# Patient Record
Sex: Male | Born: 1960 | Race: Black or African American | Hispanic: No | Smoking: Never smoker
Health system: Southern US, Community
[De-identification: ages and names within clinical notes are randomized; demographics above are authoritative.]

## PROBLEM LIST (undated history)

## (undated) DIAGNOSIS — K921 Melena: Secondary | ICD-10-CM

## (undated) DIAGNOSIS — I10 Essential (primary) hypertension: Secondary | ICD-10-CM

## (undated) DIAGNOSIS — F329 Major depressive disorder, single episode, unspecified: Secondary | ICD-10-CM

## (undated) DIAGNOSIS — N390 Urinary tract infection, site not specified: Secondary | ICD-10-CM

## (undated) DIAGNOSIS — Z8614 Personal history of Methicillin resistant Staphylococcus aureus infection: Secondary | ICD-10-CM

## (undated) DIAGNOSIS — G8220 Paraplegia, unspecified: Secondary | ICD-10-CM

## (undated) DIAGNOSIS — W3400XA Accidental discharge from unspecified firearms or gun, initial encounter: Secondary | ICD-10-CM

## (undated) DIAGNOSIS — E785 Hyperlipidemia, unspecified: Secondary | ICD-10-CM

## (undated) DIAGNOSIS — S91309A Unspecified open wound, unspecified foot, initial encounter: Secondary | ICD-10-CM

## (undated) DIAGNOSIS — F32A Depression, unspecified: Secondary | ICD-10-CM

## (undated) DIAGNOSIS — Z202 Contact with and (suspected) exposure to infections with a predominantly sexual mode of transmission: Secondary | ICD-10-CM

## (undated) DIAGNOSIS — M5412 Radiculopathy, cervical region: Secondary | ICD-10-CM

## (undated) HISTORY — DX: Major depressive disorder, single episode, unspecified: F32.9

## (undated) HISTORY — DX: Unspecified open wound, unspecified foot, initial encounter: S91.309A

## (undated) HISTORY — DX: Contact with and (suspected) exposure to infections with a predominantly sexual mode of transmission: Z20.2

## (undated) HISTORY — DX: Radiculopathy, cervical region: M54.12

## (undated) HISTORY — PX: CHOLECYSTECTOMY: SHX55

## (undated) HISTORY — PX: BACK SURGERY: SHX140

## (undated) HISTORY — DX: Hyperlipidemia, unspecified: E78.5

## (undated) HISTORY — DX: Personal history of Methicillin resistant Staphylococcus aureus infection: Z86.14

## (undated) HISTORY — DX: Melena: K92.1

## (undated) HISTORY — DX: Depression, unspecified: F32.A

## (undated) HISTORY — DX: Urinary tract infection, site not specified: N39.0

## (undated) HISTORY — PX: OTHER SURGICAL HISTORY: SHX169

---

## 1989-01-29 DIAGNOSIS — W3400XA Accidental discharge from unspecified firearms or gun, initial encounter: Secondary | ICD-10-CM

## 1989-01-29 HISTORY — DX: Accidental discharge from unspecified firearms or gun, initial encounter: W34.00XA

## 2009-01-29 HISTORY — PX: NECK SURGERY: SHX720

## 2011-02-22 ENCOUNTER — Emergency Department (HOSPITAL_COMMUNITY)
Admission: EM | Admit: 2011-02-22 | Discharge: 2011-02-23 | Disposition: A | Discharge status: Home / Self Care | Payer: Medicare Other | Attending: Emergency Medicine | Admitting: Emergency Medicine

## 2011-02-22 ENCOUNTER — Encounter (HOSPITAL_COMMUNITY): Payer: Self-pay | Admitting: Emergency Medicine

## 2011-02-22 DIAGNOSIS — R3919 Other difficulties with micturition: Secondary | ICD-10-CM | POA: Insufficient documentation

## 2011-02-22 DIAGNOSIS — I10 Essential (primary) hypertension: Secondary | ICD-10-CM | POA: Insufficient documentation

## 2011-02-22 DIAGNOSIS — G8220 Paraplegia, unspecified: Secondary | ICD-10-CM | POA: Insufficient documentation

## 2011-02-22 DIAGNOSIS — N390 Urinary tract infection, site not specified: Secondary | ICD-10-CM | POA: Insufficient documentation

## 2011-02-22 DIAGNOSIS — Z79899 Other long term (current) drug therapy: Secondary | ICD-10-CM | POA: Insufficient documentation

## 2011-02-22 HISTORY — DX: Essential (primary) hypertension: I10

## 2011-02-22 HISTORY — DX: Paraplegia, unspecified: G82.20

## 2011-02-22 LAB — URINALYSIS, ROUTINE W REFLEX MICROSCOPIC
Glucose, UA: NEGATIVE mg/dL
Hgb urine dipstick: NEGATIVE
Ketones, ur: NEGATIVE mg/dL
Protein, ur: NEGATIVE mg/dL

## 2011-02-22 LAB — URINE MICROSCOPIC-ADD ON

## 2011-02-22 MED ORDER — LIDOCAINE HCL (PF) 1 % IJ SOLN
INTRAMUSCULAR | Status: AC
  Administered 2011-02-22: 5 mL
  Filled 2011-02-22: qty 5

## 2011-02-22 MED ORDER — CEFTRIAXONE SODIUM 1 G IJ SOLR
1.0000 g | Freq: Once | INTRAMUSCULAR | Status: AC
  Administered 2011-02-22: 1 g via INTRAMUSCULAR
  Filled 2011-02-22: qty 10

## 2011-02-22 MED ORDER — CEFIXIME 400 MG PO TABS: 400.0000 mg | ORAL_TABLET | Freq: Every day | ORAL | Status: AC
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
## 2011-02-22 NOTE — ED Notes (Signed)
PT. REPORTS CONCENTRATED URINE WITH FOUL ODOR FOR 4 DAYS - STATES HE CAN TELL HE HAS UTI - PT.  CATHETERIZED SELF .

## 2011-02-23 NOTE — ED Notes (Signed)
Pt came to ED ref. Urine smelling bad and being concentrated for several days

## 2011-02-24 LAB — URINE CULTURE: Colony Count: >=100000

## 2011-02-24 NOTE — ED Provider Notes (Signed)
Medical screening examination/treatment/procedure(s) were performed by non-physician practitioner and as supervising physician I was immediately available for consultation/collaboration.   Elanor Cale, MD 02/24/11 0708 

## 2011-02-25 NOTE — ED Notes (Signed)
+   Urine. Treated with Suprax. Sensitive to same. Per protocol MD.

## 2011-03-05 ENCOUNTER — Encounter: Payer: Self-pay | Admitting: Gastroenterology

## 2011-03-20 ENCOUNTER — Ambulatory Visit: Payer: Medicare Other | Admitting: Gastroenterology

## 2011-04-06 ENCOUNTER — Ambulatory Visit (INDEPENDENT_AMBULATORY_CARE_PROVIDER_SITE_OTHER): Payer: Medicare Other | Admitting: Gastroenterology

## 2011-04-06 ENCOUNTER — Encounter: Payer: Self-pay | Admitting: Gastroenterology

## 2011-04-06 DIAGNOSIS — R109 Unspecified abdominal pain: Secondary | ICD-10-CM

## 2011-04-06 DIAGNOSIS — K5900 Constipation, unspecified: Secondary | ICD-10-CM | POA: Insufficient documentation

## 2011-04-06 DIAGNOSIS — Z0000 Encounter for general adult medical examination without abnormal findings: Secondary | ICD-10-CM

## 2011-04-06 NOTE — Patient Instructions (Signed)
Please start taking citrucel (orange flavored) powder fiber supplement.  This may cause some bloating at first but that usually goes away. Begin with a small spoonful and work your way up to a large, heaping spoonful daily over a week. Start smaller more frequent meals. We will get records from your most recent gastroenterologist (colonoscopy, EGD reports) Dr. Valarie Cones in [place]. Return to see Dr. Christella Hartigan in 4 weeks. Dietician referral for general healthy diet.
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
## 2011-04-12 ENCOUNTER — Telehealth: Payer: Self-pay | Admitting: Gastroenterology
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
## 2011-04-14 ENCOUNTER — Encounter: Payer: Self-pay | Admitting: Internal Medicine

## 2011-04-14 DIAGNOSIS — Z0000 Encounter for general adult medical examination without abnormal findings: Secondary | ICD-10-CM | POA: Insufficient documentation

## 2011-04-14 DIAGNOSIS — G8220 Paraplegia, unspecified: Secondary | ICD-10-CM | POA: Insufficient documentation

## 2011-04-14 DIAGNOSIS — I10 Essential (primary) hypertension: Secondary | ICD-10-CM | POA: Insufficient documentation

## 2011-04-17 ENCOUNTER — Ambulatory Visit (INDEPENDENT_AMBULATORY_CARE_PROVIDER_SITE_OTHER): Payer: Medicare Other | Admitting: Internal Medicine

## 2011-04-17 ENCOUNTER — Other Ambulatory Visit (INDEPENDENT_AMBULATORY_CARE_PROVIDER_SITE_OTHER): Payer: Medicare Other

## 2011-04-17 ENCOUNTER — Encounter: Payer: Self-pay | Admitting: Internal Medicine

## 2011-04-17 VITALS — BP 124/80 | HR 90 | Temp 99.3°F | Ht 71.0 in | Wt 166.0 lb

## 2011-04-17 DIAGNOSIS — Z8614 Personal history of Methicillin resistant Staphylococcus aureus infection: Secondary | ICD-10-CM | POA: Insufficient documentation

## 2011-04-17 DIAGNOSIS — R8290 Unspecified abnormal findings in urine: Secondary | ICD-10-CM | POA: Insufficient documentation

## 2011-04-17 DIAGNOSIS — R5383 Other fatigue: Secondary | ICD-10-CM

## 2011-04-17 DIAGNOSIS — I10 Essential (primary) hypertension: Secondary | ICD-10-CM

## 2011-04-17 DIAGNOSIS — E785 Hyperlipidemia, unspecified: Secondary | ICD-10-CM

## 2011-04-17 DIAGNOSIS — M5090 Cervical disc disorder, unspecified, unspecified cervical region: Secondary | ICD-10-CM | POA: Insufficient documentation

## 2011-04-17 DIAGNOSIS — R82998 Other abnormal findings in urine: Secondary | ICD-10-CM

## 2011-04-17 DIAGNOSIS — F32A Depression, unspecified: Secondary | ICD-10-CM | POA: Insufficient documentation

## 2011-04-17 DIAGNOSIS — R5381 Other malaise: Secondary | ICD-10-CM

## 2011-04-17 DIAGNOSIS — F329 Major depressive disorder, single episode, unspecified: Secondary | ICD-10-CM | POA: Insufficient documentation

## 2011-04-17 DIAGNOSIS — N320 Bladder-neck obstruction: Secondary | ICD-10-CM | POA: Insufficient documentation

## 2011-04-17 DIAGNOSIS — Z202 Contact with and (suspected) exposure to infections with a predominantly sexual mode of transmission: Secondary | ICD-10-CM | POA: Insufficient documentation

## 2011-04-17 DIAGNOSIS — I739 Peripheral vascular disease, unspecified: Secondary | ICD-10-CM | POA: Insufficient documentation

## 2011-04-17 DIAGNOSIS — S91309A Unspecified open wound, unspecified foot, initial encounter: Secondary | ICD-10-CM

## 2011-04-17 HISTORY — DX: Unspecified open wound, unspecified foot, initial encounter: S91.309A

## 2011-04-17 HISTORY — DX: Contact with and (suspected) exposure to infections with a predominantly sexual mode of transmission: Z20.2

## 2011-04-17 HISTORY — DX: Hyperlipidemia, unspecified: E78.5

## 2011-04-17 HISTORY — DX: Personal history of Methicillin resistant Staphylococcus aureus infection: Z86.14

## 2011-04-17 LAB — CBC WITH DIFFERENTIAL/PLATELET
Basophils Absolute: 0 10*3/uL (ref 0.0–0.1)
Eosinophils Absolute: 0.1 10*3/uL (ref 0.0–0.7)
Lymphocytes Relative: 22.1 % (ref 12.0–46.0)
MCHC: 33.5 g/dL (ref 30.0–36.0)
Monocytes Absolute: 0.5 10*3/uL (ref 0.1–1.0)
Neutro Abs: 4.7 10*3/uL (ref 1.4–7.7)
Neutrophils Relative %: 68.5 % (ref 43.0–77.0)
RDW: 14.1 % (ref 11.5–14.6)

## 2011-04-17 LAB — URINALYSIS, ROUTINE W REFLEX MICROSCOPIC
Bilirubin Urine: NEGATIVE
Ketones, ur: NEGATIVE
Nitrite: POSITIVE
Total Protein, Urine: NEGATIVE

## 2011-04-17 LAB — BASIC METABOLIC PANEL
CO2: 36 mEq/L — ABNORMAL HIGH (ref 19–32)
Chloride: 99 mEq/L (ref 96–112)
Potassium: 5 mEq/L (ref 3.5–5.1)
Sodium: 143 mEq/L (ref 135–145)

## 2011-04-17 LAB — HEPATIC FUNCTION PANEL
ALT: 36 U/L (ref 0–53)
Alkaline Phosphatase: 80 U/L (ref 39–117)
Bilirubin, Direct: 0.1 mg/dL (ref 0.0–0.3)
Total Protein: 7.8 g/dL (ref 6.0–8.3)

## 2011-04-17 LAB — LIPID PANEL: Total CHOL/HDL Ratio: 4

## 2011-04-17 LAB — PSA: PSA: 1.37 ng/mL (ref 0.10–4.00)

## 2011-04-17 LAB — LDL CHOLESTEROL, DIRECT: Direct LDL: 155.1 mg/dL

## 2011-04-17 MED ORDER — CEPHALEXIN 500 MG PO CAPS: 500.0000 mg | ORAL_CAPSULE | Freq: Four times a day (QID) | ORAL | Status: AC

## 2011-04-17 MED ORDER — ATENOLOL 50 MG PO TABS: 50.0000 mg | ORAL_TABLET | Freq: Two times a day (BID) | ORAL | Status: DC

## 2011-04-17 MED ORDER — CHLORTHALIDONE 25 MG PO TABS: 25.0000 mg | ORAL_TABLET | Freq: Every day | ORAL | Status: DC

## 2011-04-17 MED ORDER — PREGABALIN 300 MG PO CAPS: 300.0000 mg | ORAL_CAPSULE | Freq: Every day | ORAL | Status: DC

## 2011-04-17 MED ORDER — TIZANIDINE HCL 4 MG PO TABS: 4.0000 mg | ORAL_TABLET | Freq: Every day | ORAL | Status: DC

## 2011-04-17 MED ORDER — POTASSIUM CHLORIDE ER 10 MEQ PO TBCR: 10.0000 meq | EXTENDED_RELEASE_TABLET | Freq: Every day | ORAL | Status: DC

## 2011-04-17 MED ORDER — AMITRIPTYLINE HCL 100 MG PO TABS: 100.0000 mg | ORAL_TABLET | Freq: Every day | ORAL | Status: DC

## 2011-04-17 NOTE — Patient Instructions (Addendum)
Please sign the Release of information form for Dr Tilford Pillar in [place] Take all new medications as prescribed - the antibiotic Continue all other medications as before You are given all the refills today Please have the pharmacy call with any other refills you may need. Please go to LAB in the Basement for the blood and/or urine tests to be done today You will be contacted by phone if any changes need to be made immediately.  Otherwise, you will receive a letter about your results with an explanation. You will be contacted regarding the referral for: urology, and orthopedic, as well as the Leg circulation test (LE arterial dopplers)
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
## 2011-04-18 ENCOUNTER — Encounter: Payer: Self-pay | Admitting: Internal Medicine

## 2011-04-18 ENCOUNTER — Other Ambulatory Visit: Payer: Self-pay | Admitting: Internal Medicine

## 2011-04-18 MED ORDER — CYCLOBENZAPRINE HCL 5 MG PO TABS: 5.0000 mg | ORAL_TABLET | Freq: Three times a day (TID) | ORAL | Status: AC | PRN

## 2011-04-18 MED ORDER — ATORVASTATIN CALCIUM 20 MG PO TABS: 20.0000 mg | ORAL_TABLET | Freq: Every day | ORAL | Status: DC

## 2011-04-18 MED ORDER — AMITRIPTYLINE HCL 100 MG PO TABS: ORAL_TABLET | ORAL | Status: DC

## 2011-04-19 ENCOUNTER — Encounter: Payer: Self-pay | Admitting: Internal Medicine

## 2011-04-19 LAB — URINE CULTURE: Colony Count: >=100000

## 2011-04-22 ENCOUNTER — Encounter: Payer: Self-pay | Admitting: Internal Medicine

## 2011-04-22 DIAGNOSIS — E785 Hyperlipidemia, unspecified: Secondary | ICD-10-CM | POA: Insufficient documentation

## 2011-04-22 NOTE — Assessment & Plan Note (Addendum)
For urine studies, for antibx course,  to f/u any worsening symptoms or concerns, for urology referral  Note:  Time for pt hx, exam, review of record with pt in room, determination of diagnosis and plan for further eval and tx is > 50 min

## 2011-04-22 NOTE — Assessment & Plan Note (Signed)
Etiology unclear, Exam otherwise benign, to check labs as documented, follow with expectant management  

## 2011-04-22 NOTE — Assessment & Plan Note (Signed)
stable overall by hx and exam, most recent data reviewed with pt, and pt to continue medical treatment as before  BP Readings from Last 3 Encounters:  04/17/11 124/80  04/06/11 124/76  02/22/11 120/87

## 2011-04-22 NOTE — Assessment & Plan Note (Signed)
stable overall by hx and exam, and pt to continue medical treatment as beforem to check lpids, consider statin for LDL > 100

## 2011-04-22 NOTE — Assessment & Plan Note (Signed)
For LE art dopplers, Continue all other medications as before,  to f/u any worsening symptoms or concerns

## 2011-04-22 NOTE — Assessment & Plan Note (Signed)
With recurtting neck pain and ? cts pain to wrists/arms?  For ortho referral

## 2011-04-22 NOTE — Assessment & Plan Note (Signed)
Also for PSA,  to f/u any worsening symptoms or concerns, has been referred to urology

## 2011-04-23 ENCOUNTER — Other Ambulatory Visit: Payer: Self-pay | Admitting: Cardiology

## 2011-04-23 DIAGNOSIS — I739 Peripheral vascular disease, unspecified: Secondary | ICD-10-CM

## 2011-05-17 ENCOUNTER — Ambulatory Visit: Payer: Medicare Other | Admitting: Internal Medicine

## 2011-05-17 DIAGNOSIS — Z0289 Encounter for other administrative examinations: Secondary | ICD-10-CM

## 2011-05-18 ENCOUNTER — Ambulatory Visit: Payer: Medicare Other | Admitting: Internal Medicine

## 2011-05-23 ENCOUNTER — Ambulatory Visit: Payer: Medicare Other | Admitting: Internal Medicine

## 2011-05-23 DIAGNOSIS — Z0289 Encounter for other administrative examinations: Secondary | ICD-10-CM

## 2011-05-30 ENCOUNTER — Ambulatory Visit: Payer: Medicare Other | Admitting: *Deleted

## 2011-06-20 ENCOUNTER — Ambulatory Visit: Payer: Medicare Other | Admitting: *Deleted

## 2011-07-10 ENCOUNTER — Telehealth: Payer: Self-pay | Admitting: Internal Medicine

## 2011-07-10 NOTE — Telephone Encounter (Signed)
Caller: Zymier/Patient; PCP: Oliver Barre; CB#: [phone]; ; ; Call regarding Taking Lyrica 200mg  TID; Feels Like It Is Making Him Anxious/Jumpy; recently changed from 300mg  daily to 600mg  daily.  States he has felt  mildly nauseated "for a month," since changing the dose.  Per protocol, emergent symptoms denied; advised appt within 24 hours.  Appt sched first available 07/11/11 1615 with  Dr. Jonny Ruiz.

## 2011-07-10 NOTE — Telephone Encounter (Signed)
Not sure about this, can he call the MD who changed his prescription for advice?

## 2011-07-10 NOTE — Telephone Encounter (Signed)
Called and informed the patient of MD's instructions. 

## 2011-07-11 ENCOUNTER — Ambulatory Visit: Payer: Medicare Other | Admitting: Internal Medicine

## 2011-07-11 DIAGNOSIS — Z0289 Encounter for other administrative examinations: Secondary | ICD-10-CM

## 2011-07-12 ENCOUNTER — Other Ambulatory Visit: Payer: Self-pay | Admitting: Gastroenterology

## 2011-07-12 NOTE — Telephone Encounter (Signed)
Dr Christella Hartigan the pt would a rx for Nexium, it is not on his med list but says he was given it at a previous GI.  Do you want to send it in for him?

## 2011-07-12 NOTE — Telephone Encounter (Signed)
The pt will try OTC prilosec but does not want to schedule ROV at this time.  He will call back when he is not at work

## 2011-07-12 NOTE — Telephone Encounter (Signed)
Recommend OTC prilosec, prevacid or generic equivalent once daily 20-30 min before breakfast meal.  He has not followed up since ngi visit 3 months ago, can you reschedule his rov and he and I can discuss response to OTC PPI and consider nexium at that point.  Thanks

## 2011-07-20 ENCOUNTER — Ambulatory Visit: Payer: Medicare Other | Admitting: Gastroenterology

## 2011-07-23 ENCOUNTER — Telehealth: Payer: Self-pay | Admitting: Gastroenterology

## 2011-07-23 NOTE — Telephone Encounter (Signed)
Message copied by Arna Snipe on Mon Jul 23, 2011  8:11 AM ------      Message from: Donata Duff      Created: Fri Jul 20, 2011  9:06 AM       Do not bill

## 2011-07-31 ENCOUNTER — Telehealth: Payer: Self-pay

## 2011-07-31 DIAGNOSIS — R634 Abnormal weight loss: Secondary | ICD-10-CM

## 2011-07-31 NOTE — Telephone Encounter (Signed)
The patient called back and he did not need the two referrals he requested.

## 2011-07-31 NOTE — Telephone Encounter (Signed)
Called the patient left message to call back 

## 2011-07-31 NOTE — Telephone Encounter (Signed)
The patient called back to inform he would like a Dietician referral.  He stated his GI MD suggested to help with his diet. Please advise

## 2011-07-31 NOTE — Telephone Encounter (Signed)
Dietary done  Would still need a reason/diagnosis for ENT referral if he wants this (actually since he has medicare, he does not need a formal paper referral, he can self-refer - I would suggest Dr Haroldine Laws or another in his office)

## 2011-07-31 NOTE — Telephone Encounter (Signed)
Patient is requesting a referral to ENT and Dietician.

## 2011-07-31 NOTE — Telephone Encounter (Signed)
I would need a reason for both please

## 2011-08-20 ENCOUNTER — Telehealth: Payer: Self-pay | Admitting: Internal Medicine

## 2011-08-20 ENCOUNTER — Encounter: Payer: Self-pay | Admitting: Internal Medicine

## 2011-08-20 ENCOUNTER — Ambulatory Visit (INDEPENDENT_AMBULATORY_CARE_PROVIDER_SITE_OTHER): Payer: Medicare Other | Admitting: Internal Medicine

## 2011-08-20 VITALS — BP 118/80 | HR 77 | Temp 98.1°F | Ht 70.0 in | Wt 163.0 lb

## 2011-08-20 DIAGNOSIS — E785 Hyperlipidemia, unspecified: Secondary | ICD-10-CM

## 2011-08-20 DIAGNOSIS — M542 Cervicalgia: Secondary | ICD-10-CM

## 2011-08-20 DIAGNOSIS — M5412 Radiculopathy, cervical region: Secondary | ICD-10-CM

## 2011-08-20 DIAGNOSIS — I10 Essential (primary) hypertension: Secondary | ICD-10-CM

## 2011-08-20 HISTORY — DX: Radiculopathy, cervical region: M54.12
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
## 2011-08-20 NOTE — Assessment & Plan Note (Signed)
With worsening left neck pain/HA and numbness/weakness LUE- for MRI, and refer NS

## 2011-08-20 NOTE — Patient Instructions (Addendum)
Continue all other medications as before You will be contacted regarding the referral for: MRI and Neurosurgury referral Please continue your efforts at being more active, low cholesterol diet, and weight control. OK to cancel the Aug 8 appointment with me Please return in 6 months, or sooner if needed

## 2011-08-20 NOTE — Telephone Encounter (Signed)
Caller: [name]/Patient; PCP: Oliver Barre; CB#: [phone];  Call regarding Intermittent  Episodes of Lightheadedness With Intermittent Headaches From Base of Skull;  Onset 08/18/11.  Afebrile.  BP 122/84 at 0730.  New onset of mild blurred vision during headache/lightheadness episode at 0730.  Headache pain rated  7/10.  No current symptoms.  Advised to see MD now for sudden loss or change in vision and not previously evaluated per Headache Guideline. No same day appt slots available. Called office/Nancy; appt scheduled for 0915 08/20/11 with Dr Jonny Ruiz.

## 2011-08-26 ENCOUNTER — Encounter: Payer: Self-pay | Admitting: Internal Medicine

## 2011-08-26 DIAGNOSIS — M542 Cervicalgia: Secondary | ICD-10-CM | POA: Insufficient documentation

## 2011-08-26 NOTE — Assessment & Plan Note (Signed)
D/w pt, declines lipitor at OV, has appt with nutrition soon

## 2011-08-26 NOTE — Assessment & Plan Note (Signed)
Suspect c/w cervicogenic/MSK pain vs ? Migraine, declines further tx for now as is mild overall, tylenol prn

## 2011-08-26 NOTE — Assessment & Plan Note (Signed)
stable overall by hx and exam, most recent data reviewed with pt, and pt to continue medical treatment as before BP Readings from Last 3 Encounters:  08/20/11 118/80  04/17/11 124/80  04/06/11 124/76

## 2011-08-27 ENCOUNTER — Encounter: Payer: Medicare Other | Attending: Internal Medicine | Admitting: *Deleted

## 2011-08-27 ENCOUNTER — Encounter: Payer: Self-pay | Admitting: *Deleted

## 2011-08-27 DIAGNOSIS — Z713 Dietary counseling and surveillance: Secondary | ICD-10-CM | POA: Insufficient documentation

## 2011-08-27 DIAGNOSIS — E785 Hyperlipidemia, unspecified: Secondary | ICD-10-CM | POA: Insufficient documentation

## 2011-08-27 DIAGNOSIS — K5900 Constipation, unspecified: Secondary | ICD-10-CM | POA: Insufficient documentation

## 2011-08-27 NOTE — Patient Instructions (Addendum)
Plan: Consider decreasing lactose by trying Almond, Lactaid or Soy milks Consider increasing fiber intake (soluble and insoluble) by adding Citracel or Metamucil to foods as well as in water Add fiber by choosing whole fruits and whole grain breads, rice etc Limit animal (saturated fats) and choose more plant based (unsaturated fats) to help with total cholesterol Continue with current activity level
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
## 2011-08-28 ENCOUNTER — Other Ambulatory Visit: Payer: Self-pay | Admitting: Internal Medicine

## 2011-08-28 ENCOUNTER — Telehealth: Payer: Self-pay

## 2011-08-28 DIAGNOSIS — M542 Cervicalgia: Secondary | ICD-10-CM

## 2011-08-28 NOTE — Telephone Encounter (Signed)
[facility] informed.
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
## 2011-09-03 ENCOUNTER — Ambulatory Visit: Payer: Medicare Other | Admitting: Internal Medicine

## 2011-09-04 ENCOUNTER — Ambulatory Visit: Payer: Medicare Other | Admitting: Internal Medicine

## 2011-09-04 DIAGNOSIS — Z0289 Encounter for other administrative examinations: Secondary | ICD-10-CM

## 2011-09-10 ENCOUNTER — Other Ambulatory Visit: Payer: Self-pay | Admitting: Internal Medicine

## 2011-09-10 DIAGNOSIS — Z139 Encounter for screening, unspecified: Secondary | ICD-10-CM

## 2011-09-14 ENCOUNTER — Ambulatory Visit
Admission: RE | Admit: 2011-09-14 | Discharge: 2011-09-14 | Disposition: A | Discharge status: Home / Self Care | Payer: Medicare Other | Source: Ambulatory Visit | Attending: Internal Medicine | Admitting: Internal Medicine

## 2011-09-14 ENCOUNTER — Encounter: Payer: Self-pay | Admitting: Internal Medicine

## 2011-09-14 ENCOUNTER — Telehealth: Payer: Self-pay

## 2011-09-14 DIAGNOSIS — M542 Cervicalgia: Secondary | ICD-10-CM

## 2011-09-14 DIAGNOSIS — Z139 Encounter for screening, unspecified: Secondary | ICD-10-CM

## 2011-09-14 DIAGNOSIS — M5412 Radiculopathy, cervical region: Secondary | ICD-10-CM
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
## 2011-09-17 NOTE — Telephone Encounter (Signed)
Added note to permanent comments.

## 2011-09-24 ENCOUNTER — Telehealth: Payer: Self-pay | Admitting: Internal Medicine

## 2011-09-24 DIAGNOSIS — M549 Dorsalgia, unspecified: Secondary | ICD-10-CM

## 2011-09-24 NOTE — Telephone Encounter (Signed)
Pt req referral to [facility]. Last OV was 08/20/11 and pt stated that he didn't want referral then. Please advise.

## 2011-09-24 NOTE — Telephone Encounter (Signed)
Called the patient left detailed message referral has been made requested.

## 2011-09-24 NOTE — Telephone Encounter (Signed)
Done per emr 

## 2011-10-18 ENCOUNTER — Ambulatory Visit: Payer: Medicare Other | Admitting: Internal Medicine

## 2011-10-22 ENCOUNTER — Ambulatory Visit: Payer: Medicare Other | Admitting: Gastroenterology

## 2011-10-22 ENCOUNTER — Telehealth: Payer: Self-pay | Admitting: Gastroenterology

## 2011-10-22 NOTE — Telephone Encounter (Signed)
He has no showed twice in past 2-3 months, needs to be billed the no show fee and seen by PCP and re-referred (before rescheduling) if they still feel he needs to be seen.

## 2011-10-22 NOTE — Telephone Encounter (Signed)
Pt has been charged

## 2011-10-23 ENCOUNTER — Other Ambulatory Visit: Payer: Medicare Other

## 2011-10-23 ENCOUNTER — Ambulatory Visit (INDEPENDENT_AMBULATORY_CARE_PROVIDER_SITE_OTHER): Payer: Medicare Other | Admitting: Internal Medicine

## 2011-10-23 ENCOUNTER — Encounter: Payer: Self-pay | Admitting: Internal Medicine

## 2011-10-23 VITALS — BP 112/80 | HR 98 | Temp 98.3°F | Ht 71.0 in | Wt 160.0 lb

## 2011-10-23 DIAGNOSIS — Z202 Contact with and (suspected) exposure to infections with a predominantly sexual mode of transmission: Secondary | ICD-10-CM

## 2011-10-23 DIAGNOSIS — B179 Acute viral hepatitis, unspecified: Secondary | ICD-10-CM

## 2011-10-23 DIAGNOSIS — A64 Unspecified sexually transmitted disease: Secondary | ICD-10-CM

## 2011-10-23 DIAGNOSIS — A549 Gonococcal infection, unspecified: Secondary | ICD-10-CM

## 2011-10-23 DIAGNOSIS — K7200 Acute and subacute hepatic failure without coma: Secondary | ICD-10-CM

## 2011-10-23 DIAGNOSIS — Z20828 Contact with and (suspected) exposure to other viral communicable diseases: Secondary | ICD-10-CM

## 2011-10-23 DIAGNOSIS — A5400 Gonococcal infection of lower genitourinary tract, unspecified: Secondary | ICD-10-CM

## 2011-10-23 DIAGNOSIS — R945 Abnormal results of liver function studies: Secondary | ICD-10-CM

## 2011-10-23 DIAGNOSIS — R7989 Other specified abnormal findings of blood chemistry: Secondary | ICD-10-CM

## 2011-10-23 DIAGNOSIS — I10 Essential (primary) hypertension: Secondary | ICD-10-CM

## 2011-10-23 DIAGNOSIS — N529 Male erectile dysfunction, unspecified: Secondary | ICD-10-CM

## 2011-10-23 DIAGNOSIS — G8220 Paraplegia, unspecified: Secondary | ICD-10-CM

## 2011-10-23 DIAGNOSIS — Z23 Encounter for immunization: Secondary | ICD-10-CM

## 2011-10-23 MED ORDER — TADALAFIL 20 MG PO TABS: 20.0000 mg | ORAL_TABLET | Freq: Every day | ORAL | Status: DC | PRN

## 2011-10-23 MED ORDER — MILNACIPRAN HCL 50 MG PO TABS: 50.0000 mg | ORAL_TABLET | Freq: Two times a day (BID) | ORAL | Status: DC

## 2011-10-23 MED ORDER — SILDENAFIL CITRATE 100 MG PO TABS: 100.0000 mg | ORAL_TABLET | Freq: Every day | ORAL | Status: DC | PRN

## 2011-10-23 NOTE — Assessment & Plan Note (Signed)
Onigoing, or viagra/cialis coupon and rx
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
## 2011-10-23 NOTE — Assessment & Plan Note (Signed)
stable overall by hx and exam, most recent data reviewed with pt, and pt to continue medical treatment as before BP Readings from Last 3 Encounters:  10/23/11 112/80  08/20/11 118/80  04/17/11 124/80

## 2011-10-23 NOTE — Assessment & Plan Note (Signed)
For lab eval, r/o std

## 2011-10-23 NOTE — Assessment & Plan Note (Signed)
Skin check without skin breakdown,  to f/u any worsening symptoms or concerns

## 2011-10-23 NOTE — Patient Instructions (Addendum)
You had the flu shot today No bed sores today You are given the prescriptions and coupons for the cialis and viagra today Please go to LAB in the Basement for the blood and/or urine tests to be done today You will be contacted by phone if any changes need to be made immediately.  Otherwise, you will receive a letter about your results with an explanation. Please remember to sign up for My Chart at your earliest convenience, as this will be important to you in the future with finding out test results.

## 2011-10-24 ENCOUNTER — Encounter: Payer: Self-pay | Admitting: Internal Medicine

## 2011-10-24 LAB — HIV ANTIBODY (ROUTINE TESTING W REFLEX): HIV: NONREACTIVE

## 2011-10-24 LAB — HEPATITIS PANEL, ACUTE
Hep B C IgM: NEGATIVE
Hepatitis B Surface Ag: NEGATIVE

## 2011-10-24 LAB — GC/CHLAMYDIA PROBE AMP, URINE: Chlamydia, Swab/Urine, PCR: NEGATIVE

## 2011-10-26 ENCOUNTER — Other Ambulatory Visit: Payer: Self-pay | Admitting: Internal Medicine

## 2011-10-29 NOTE — Telephone Encounter (Signed)
Done erx 

## 2011-11-01 ENCOUNTER — Telehealth: Payer: Self-pay | Admitting: Gastroenterology

## 2011-11-01 NOTE — Telephone Encounter (Signed)
Message copied by Arna Snipe on Thu Nov 01, 2011  8:51 AM ------      Message from: Ok Anis A      Created: Mon Oct 22, 2011 11:30 AM       Bill patient

## 2011-11-23 ENCOUNTER — Ambulatory Visit: Payer: Medicare Other | Admitting: Gastroenterology

## 2011-11-30 ENCOUNTER — Other Ambulatory Visit: Payer: Self-pay

## 2011-11-30 MED ORDER — ATENOLOL 50 MG PO TABS: 50.0000 mg | ORAL_TABLET | Freq: Two times a day (BID) | ORAL | Status: DC

## 2011-12-31 ENCOUNTER — Ambulatory Visit: Payer: Medicare Other | Admitting: Gastroenterology

## 2012-01-09 ENCOUNTER — Telehealth: Payer: Self-pay | Admitting: Internal Medicine

## 2012-01-09 ENCOUNTER — Ambulatory Visit: Payer: Self-pay | Admitting: Internal Medicine

## 2012-01-09 NOTE — Telephone Encounter (Signed)
Patient Information:  Caller Name: Michiah  Phone: [phone]  Patient: [name]  Gender: Male  DOB: [birth year]  Age: 51 Years  PCP: Oliver Barre (Adults only)  Office Follow Up:  Does the office need to follow up with this patient?: No  Instructions For The Office: N/A   Symptoms  Reason For Call & Symptoms: Patient reports pain in his lower back and legs. Patient is paraplegic. Sees a pain clinic for back pain. Reports he is constipated at this time. Reports abdominal distension and pain when touching abdomen. Is passing very small amount of gas at this time. Last normal bowel movement: 01/05/12.  Reviewed Health History In EMR: Yes  Reviewed Medications In EMR: Yes  Reviewed Allergies In EMR: Yes  Reviewed Surgeries / Procedures: Yes  Date of Onset of Symptoms: 01/08/2012  Treatments Tried: Oxycodone for pain - helped a little. Amitiza for constipation - no relief.  Treatments Tried Worked: Yes  Guideline(s) Used:  Back Pain  Constipation  Disposition Per Guideline:   See Today in Office  Reason For Disposition Reached:   Abdomen is more swollen than usual  Advice Given:  Advised patient to go to ED per Dr. Yetta Barre' instruction if anything get worse throughout the day before his appointment.  Appointment Scheduled:  01/09/2012 16:15:00 Appointment Scheduled Provider:  Oliver Barre (Adults only)

## 2012-01-14 ENCOUNTER — Emergency Department (HOSPITAL_COMMUNITY)
Admission: EM | Admit: 2012-01-14 | Discharge: 2012-01-14 | Disposition: A | Discharge status: Home / Self Care | Payer: Medicare Other | Attending: Emergency Medicine | Admitting: Emergency Medicine

## 2012-01-14 ENCOUNTER — Emergency Department (HOSPITAL_COMMUNITY): Payer: Medicare Other

## 2012-01-14 ENCOUNTER — Encounter (HOSPITAL_COMMUNITY): Payer: Self-pay | Admitting: Emergency Medicine

## 2012-01-14 DIAGNOSIS — F3289 Other specified depressive episodes: Secondary | ICD-10-CM | POA: Insufficient documentation

## 2012-01-14 DIAGNOSIS — Z8739 Personal history of other diseases of the musculoskeletal system and connective tissue: Secondary | ICD-10-CM | POA: Insufficient documentation

## 2012-01-14 DIAGNOSIS — R369 Urethral discharge, unspecified: Secondary | ICD-10-CM | POA: Insufficient documentation

## 2012-01-14 DIAGNOSIS — F329 Major depressive disorder, single episode, unspecified: Secondary | ICD-10-CM | POA: Insufficient documentation

## 2012-01-14 DIAGNOSIS — R109 Unspecified abdominal pain: Secondary | ICD-10-CM | POA: Insufficient documentation

## 2012-01-14 DIAGNOSIS — R509 Fever, unspecified: Secondary | ICD-10-CM | POA: Insufficient documentation

## 2012-01-14 DIAGNOSIS — Z8719 Personal history of other diseases of the digestive system: Secondary | ICD-10-CM | POA: Insufficient documentation

## 2012-01-14 DIAGNOSIS — Z87440 Personal history of urinary (tract) infections: Secondary | ICD-10-CM | POA: Insufficient documentation

## 2012-01-14 DIAGNOSIS — I10 Essential (primary) hypertension: Secondary | ICD-10-CM | POA: Insufficient documentation

## 2012-01-14 DIAGNOSIS — E785 Hyperlipidemia, unspecified: Secondary | ICD-10-CM | POA: Insufficient documentation

## 2012-01-14 DIAGNOSIS — Z202 Contact with and (suspected) exposure to infections with a predominantly sexual mode of transmission: Secondary | ICD-10-CM | POA: Insufficient documentation

## 2012-01-14 DIAGNOSIS — Z8614 Personal history of Methicillin resistant Staphylococcus aureus infection: Secondary | ICD-10-CM | POA: Insufficient documentation

## 2012-01-14 DIAGNOSIS — Z9889 Other specified postprocedural states: Secondary | ICD-10-CM | POA: Insufficient documentation

## 2012-01-14 DIAGNOSIS — Z8669 Personal history of other diseases of the nervous system and sense organs: Secondary | ICD-10-CM | POA: Insufficient documentation

## 2012-01-14 DIAGNOSIS — Z79899 Other long term (current) drug therapy: Secondary | ICD-10-CM | POA: Insufficient documentation

## 2012-01-14 LAB — URINALYSIS, ROUTINE W REFLEX MICROSCOPIC
Nitrite: NEGATIVE
Specific Gravity, Urine: 1.011 (ref 1.005–1.030)
pH: 5 (ref 5.0–8.0)

## 2012-01-14 LAB — URINE MICROSCOPIC-ADD ON

## 2012-01-14 MED ORDER — AZITHROMYCIN 250 MG PO TABS
1000.0000 mg | ORAL_TABLET | Freq: Once | ORAL | Status: AC
  Administered 2012-01-14: 1000 mg via ORAL
  Filled 2012-01-14: qty 4

## 2012-01-14 MED ORDER — LIDOCAINE HCL (PF) 1 % IJ SOLN
INTRAMUSCULAR | Status: AC
  Administered 2012-01-14: 2.2 mL
  Filled 2012-01-14: qty 5

## 2012-01-14 MED ORDER — DOXYCYCLINE HYCLATE 100 MG PO CAPS: 100.0000 mg | ORAL_CAPSULE | Freq: Two times a day (BID) | ORAL | Status: DC

## 2012-01-14 MED ORDER — DICYCLOMINE HCL 20 MG PO TABS: 20.0000 mg | ORAL_TABLET | Freq: Two times a day (BID) | ORAL | Status: DC

## 2012-01-14 MED ORDER — CEFTRIAXONE SODIUM 250 MG IJ SOLR
250.0000 mg | Freq: Once | INTRAMUSCULAR | Status: AC
  Administered 2012-01-14: 250 mg via INTRAMUSCULAR
  Filled 2012-01-14: qty 250

## 2012-01-14 NOTE — ED Notes (Signed)
Patient d/c'd home by [facility] with all personal belongings
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
## 2012-01-14 NOTE — ED Notes (Addendum)
Patient still complains of pain. Reports that he took his own Oxycodone IR 10 mg and that the EDP said that it was ok for him to take his medication. Verified this with Dr. Hyacinth Meeker, EDP.

## 2012-01-14 NOTE — ED Notes (Signed)
Patient with abdominal pain, nausea and vomiting with slight fever.  Started at 2100 tonight.  Now with lower back pain.

## 2012-01-14 NOTE — ED Notes (Signed)
Pt. Reports intermittent lower abdominal pain and lower back pain. States is being seen at pain clinic, on lyrica and oxycodone. Pt. States constipation since starting oxycodone. LMB yesterday, soft. Hx of colonostomy and reversal.
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
## 2012-01-15 LAB — GC/CHLAMYDIA PROBE AMP
CT Probe RNA: POSITIVE — AB
GC Probe RNA: NEGATIVE

## 2012-01-16 ENCOUNTER — Telehealth (HOSPITAL_COMMUNITY): Payer: Self-pay | Admitting: Emergency Medicine

## 2012-01-16 LAB — URINE CULTURE

## 2012-01-16 NOTE — ED Notes (Signed)
Results received from [facility].  (+) Chlamydia treated with Zithromax and Rocephin.  (+) UTI -> EColi Given Rx for Doxycycline -> STS. [employer]HS form completed and faxed.  Call and notify patient.

## 2012-01-17 ENCOUNTER — Telehealth (HOSPITAL_COMMUNITY): Payer: Self-pay | Admitting: Emergency Medicine
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
## 2012-02-06 ENCOUNTER — Telehealth: Payer: Self-pay

## 2012-02-06 DIAGNOSIS — M549 Dorsalgia, unspecified: Secondary | ICD-10-CM

## 2012-02-06 NOTE — Telephone Encounter (Signed)
Done per emr 

## 2012-02-06 NOTE — Telephone Encounter (Signed)
The patient would like a referral to a chiropractor, please advise

## 2012-02-18 ENCOUNTER — Ambulatory Visit: Payer: Medicare Other | Admitting: Internal Medicine

## 2012-02-20 ENCOUNTER — Ambulatory Visit: Payer: Medicare Other | Admitting: Internal Medicine

## 2012-02-21 ENCOUNTER — Telehealth: Payer: Self-pay | Admitting: Gastroenterology

## 2012-02-21 NOTE — Telephone Encounter (Signed)
Message copied by Arna Snipe on Thu Feb 21, 2012 12:00 PM ------      Message from: Donata Duff      Created: Mon Dec 31, 2011 10:04 AM       Annette Stable pt

## 2012-02-22 ENCOUNTER — Ambulatory Visit: Payer: Medicare Other | Admitting: Internal Medicine

## 2012-02-25 ENCOUNTER — Encounter: Payer: Self-pay | Admitting: Internal Medicine

## 2012-02-25 ENCOUNTER — Ambulatory Visit (INDEPENDENT_AMBULATORY_CARE_PROVIDER_SITE_OTHER): Payer: Medicare Other | Admitting: Internal Medicine

## 2012-02-25 VITALS — BP 110/78 | HR 72 | Temp 97.7°F | Wt 160.0 lb

## 2012-02-25 DIAGNOSIS — E785 Hyperlipidemia, unspecified: Secondary | ICD-10-CM

## 2012-02-25 DIAGNOSIS — G8929 Other chronic pain: Secondary | ICD-10-CM

## 2012-02-25 DIAGNOSIS — I10 Essential (primary) hypertension: Secondary | ICD-10-CM

## 2012-02-25 DIAGNOSIS — M549 Dorsalgia, unspecified: Secondary | ICD-10-CM

## 2012-02-25 MED ORDER — AMITRIPTYLINE HCL 100 MG PO TABS: 100.0000 mg | ORAL_TABLET | Freq: Every day | ORAL | Status: DC

## 2012-02-25 MED ORDER — PREGABALIN 150 MG PO CAPS: 150.0000 mg | ORAL_CAPSULE | Freq: Two times a day (BID) | ORAL | Status: DC

## 2012-02-25 MED ORDER — HYDROCODONE-ACETAMINOPHEN 10-325 MG PO TABS: 1.0000 | ORAL_TABLET | Freq: Every day | ORAL | Status: DC | PRN

## 2012-02-25 MED ORDER — OXYCODONE HCL 10 MG PO TB12: 10.0000 mg | ORAL_TABLET | Freq: Two times a day (BID) | ORAL | Status: DC

## 2012-02-25 NOTE — Patient Instructions (Addendum)
Your EKG was ok today Please continue all other medications as before, and refills have been done if requested. Please have the pharmacy call with any other refills you may need. You will be contacted regarding the referral for: pain clinic Please go to the XRAY Department in the Basement (go straight as you get off the elevator) for the x-ray testing You will be contacted by phone if any changes need to be made immediately.  Otherwise, you will receive a letter about your results with an explanation, but please check with MyChart first. Thank you for enrolling in MyChart. Please follow the instructions below to securely access your online medical record. MyChart allows you to send messages to your doctor, view your test results, renew your prescriptions, schedule appointments, and more. To Log into My Chart online, please go by [employer] or [employer] to [email or web address], or download the MyChart App from the [employer] of [employer] .  Your Username is: [email or web address] (pass [phone]) Please send a practice Message on Mychart later today. Please return in 2 months, or sooner if needed
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
## 2012-02-25 NOTE — Assessment & Plan Note (Addendum)
Has also been seeing chiropracter who asks for plain films, will assist with this, pain meds refilled after review of [place] controlled substance database, appaers to be no abuse/overuse, refer to new pain clinic

## 2012-02-25 NOTE — Assessment & Plan Note (Addendum)
ECG reviewed as per emr,  BP Readings from Last 3 Encounters:  02/25/12 110/78  01/14/12 109/69  10/23/11 112/80   stable overall by history and exam, recent data reviewed with pt, and pt to continue medical treatment as before,  to f/u any worsening symptoms or concerns

## 2012-02-25 NOTE — Assessment & Plan Note (Signed)
Lab Results  Component Value Date   CHOL 236* 04/17/2011   HDL 56.60 04/17/2011   LDLDIRECT 155.1 04/17/2011   TRIG 104.0 04/17/2011   CHOLHDL 4 04/17/2011   To cont diet, declines further tx such as statin

## 2012-03-03 ENCOUNTER — Telehealth: Payer: Self-pay | Admitting: Internal Medicine

## 2012-03-03 NOTE — Telephone Encounter (Signed)
Patient Information:  Caller Name: Anastasio  Phone: [phone]  Patient: [name], [name]  Gender: Male  DOB: [birth year]  Age: 52 Years  PCP: Oliver Barre (Adults only)  Office Follow Up:  Does the office need to follow up with this patient?: No  Instructions For The Office: N/A   Symptoms  Reason For Call & Symptoms: Having blood in his stool and abd pain.  Started 03/01/12.  The blood is bright red. Should he come to the office or to the ED?  Pt is parapalegic and uses digital stimulation to have a BM.   Reviewed Health History In EMR: Yes  Reviewed Medications In EMR: Yes  Reviewed Allergies In EMR: Yes  Reviewed Surgeries / Procedures: No  Date of Onset of Symptoms: 03/01/2012  Guideline(s) Used:  Abdominal Pain - Male  Disposition Per Guideline:   Go to ED Now. He will call his daughter to bo to [facility] ED.   Reason For Disposition Reached:   Bloody, black, or tarry bowel movements  Advice Given:  Call Back If:  You become worse.

## 2012-03-06 ENCOUNTER — Telehealth: Payer: Self-pay

## 2012-03-06 DIAGNOSIS — G8220 Paraplegia, unspecified: Secondary | ICD-10-CM

## 2012-03-06 NOTE — Telephone Encounter (Signed)
Per pt phone -   The patient would like a referral to Dr. Othella Boyer [facility]. She specializes in spinal cord injuries. Her phone number is [phone] and fax number is [phone]. Please advise           Encounter MyChart Messages    OK for referral - done

## 2012-03-06 NOTE — Telephone Encounter (Signed)
The patient would like a referral to Dr. Othella Boyer [facility].  She specializes in spinal cord injuries.  Her phone number is [phone] and fax number is [phone].  Please advise

## 2012-03-19 ENCOUNTER — Encounter: Payer: Self-pay | Admitting: Physical Medicine & Rehabilitation

## 2012-04-04 ENCOUNTER — Ambulatory Visit: Payer: Medicare Other | Admitting: Physical Medicine & Rehabilitation

## 2012-04-10 ENCOUNTER — Ambulatory Visit: Payer: Medicare Other | Admitting: Physical Medicine & Rehabilitation

## 2012-04-10 ENCOUNTER — Encounter: Payer: Medicare Other | Attending: Physical Medicine & Rehabilitation

## 2012-04-22 ENCOUNTER — Ambulatory Visit: Payer: Medicare Other | Admitting: Internal Medicine

## 2012-04-22 DIAGNOSIS — Z0289 Encounter for other administrative examinations: Secondary | ICD-10-CM

## 2012-04-29 ENCOUNTER — Telehealth: Payer: Self-pay | Admitting: Internal Medicine

## 2012-04-29 MED ORDER — PREGABALIN 150 MG PO CAPS: 150.0000 mg | ORAL_CAPSULE | Freq: Every day | ORAL | Status: DC

## 2012-04-29 MED ORDER — AMITRIPTYLINE HCL 100 MG PO TABS: 100.0000 mg | ORAL_TABLET | Freq: Every day | ORAL | Status: DC

## 2012-04-29 MED ORDER — OXYCODONE HCL 10 MG PO TB12: 10.0000 mg | ORAL_TABLET | Freq: Two times a day (BID) | ORAL | Status: DC

## 2012-04-29 NOTE — Telephone Encounter (Signed)
Called the patient informed of MD instructions on refills and informed to pickup hardcopy at the front desk.

## 2012-04-29 NOTE — Telephone Encounter (Signed)
There is no record of taking 2 elavil at night, though he did have a rx for 1.5 tabs qhs; I think Ok to change to this - done erx  Ok to take the lyrica once daily, and I have corrected the med list  Ok for oxycodone   - Done hardcopy to [employer]

## 2012-04-29 NOTE — Telephone Encounter (Signed)
Called with medication questions and refill request.    Questioning ordered dose of Amitriptyline.  Reports Rx specifies one daily at bedtime but MD changed it to 2 daily at bedtime at last office visit 02/25/12.  Is out of Amitriptyline since 04/28/12.   Also, questioning why Lyrica is ordered BID since he only takes it once daily.    Finally, requesting refill of Oxycodone.  Please call back to clarify medications and advise when Oxycodone is ready for pick up. [employer].

## 2012-05-12 ENCOUNTER — Encounter (HOSPITAL_COMMUNITY): Payer: Self-pay | Admitting: *Deleted

## 2012-05-12 ENCOUNTER — Emergency Department (HOSPITAL_COMMUNITY)
Admission: EM | Admit: 2012-05-12 | Discharge: 2012-05-13 | Disposition: A | Discharge status: Home / Self Care | Payer: Medicare Other | Attending: Emergency Medicine | Admitting: Emergency Medicine

## 2012-05-12 DIAGNOSIS — F329 Major depressive disorder, single episode, unspecified: Secondary | ICD-10-CM | POA: Insufficient documentation

## 2012-05-12 DIAGNOSIS — Z8639 Personal history of other endocrine, nutritional and metabolic disease: Secondary | ICD-10-CM | POA: Insufficient documentation

## 2012-05-12 DIAGNOSIS — Z862 Personal history of diseases of the blood and blood-forming organs and certain disorders involving the immune mechanism: Secondary | ICD-10-CM | POA: Insufficient documentation

## 2012-05-12 DIAGNOSIS — Z8719 Personal history of other diseases of the digestive system: Secondary | ICD-10-CM | POA: Insufficient documentation

## 2012-05-12 DIAGNOSIS — Z8614 Personal history of Methicillin resistant Staphylococcus aureus infection: Secondary | ICD-10-CM | POA: Insufficient documentation

## 2012-05-12 DIAGNOSIS — Z79899 Other long term (current) drug therapy: Secondary | ICD-10-CM | POA: Insufficient documentation

## 2012-05-12 DIAGNOSIS — I10 Essential (primary) hypertension: Secondary | ICD-10-CM | POA: Insufficient documentation

## 2012-05-12 DIAGNOSIS — R109 Unspecified abdominal pain: Secondary | ICD-10-CM | POA: Insufficient documentation

## 2012-05-12 DIAGNOSIS — Z8739 Personal history of other diseases of the musculoskeletal system and connective tissue: Secondary | ICD-10-CM | POA: Insufficient documentation

## 2012-05-12 DIAGNOSIS — F3289 Other specified depressive episodes: Secondary | ICD-10-CM | POA: Insufficient documentation

## 2012-05-12 DIAGNOSIS — Z8669 Personal history of other diseases of the nervous system and sense organs: Secondary | ICD-10-CM | POA: Insufficient documentation

## 2012-05-12 DIAGNOSIS — Z87440 Personal history of urinary (tract) infections: Secondary | ICD-10-CM | POA: Insufficient documentation

## 2012-05-12 DIAGNOSIS — Z8619 Personal history of other infectious and parasitic diseases: Secondary | ICD-10-CM | POA: Insufficient documentation

## 2012-05-12 DIAGNOSIS — Z87828 Personal history of other (healed) physical injury and trauma: Secondary | ICD-10-CM | POA: Insufficient documentation

## 2012-05-12 LAB — COMPREHENSIVE METABOLIC PANEL
ALT: 37 U/L (ref 0–53)
CO2: 33 mEq/L — ABNORMAL HIGH (ref 19–32)
Calcium: 9.5 mg/dL (ref 8.4–10.5)
GFR calc Af Amer: >90 mL/min (ref >90)
GFR calc non Af Amer: >90 mL/min (ref >90)
Glucose, Bld: 91 mg/dL (ref 70–99)
Sodium: 137 mEq/L (ref 135–145)

## 2012-05-12 LAB — CBC WITH DIFFERENTIAL/PLATELET
Eosinophils Relative: 2 % (ref 0–5)
HCT: 40.3 % (ref 39.0–52.0)
Hemoglobin: 13.8 g/dL (ref 13.0–17.0)
Lymphocytes Relative: 17 % (ref 12–46)
Lymphs Abs: 1.4 10*3/uL (ref 0.7–4.0)
MCV: 89.6 fL (ref 78.0–100.0)
Monocytes Absolute: 0.7 10*3/uL (ref 0.1–1.0)
Platelets: 258 10*3/uL (ref 150–400)
RBC: 4.5 MIL/uL (ref 4.22–5.81)
WBC: 8.2 10*3/uL (ref 4.0–10.5)

## 2012-05-12 NOTE — ED Notes (Signed)
Pt states that he began to have abdominal pain ion Sunday to left side that radiates to the left flank; pt reports that he has the urge to urinate but that the volume has decreased; pt states that he usually obtains approx and that on hos last vois it was only 25ml; pt denies nausea or vomiting.

## 2012-05-13 ENCOUNTER — Emergency Department (HOSPITAL_COMMUNITY): Payer: Medicare Other

## 2012-05-13 LAB — URINALYSIS, ROUTINE W REFLEX MICROSCOPIC
Bilirubin Urine: NEGATIVE
Hgb urine dipstick: NEGATIVE
Ketones, ur: NEGATIVE mg/dL
Protein, ur: NEGATIVE mg/dL
Urobilinogen, UA: 1 mg/dL (ref 0.0–1.0)

## 2012-05-13 MED ORDER — HYDROMORPHONE HCL PF 2 MG/ML IJ SOLN
2.0000 mg | Freq: Once | INTRAMUSCULAR | Status: AC
  Administered 2012-05-13: 2 mg via INTRAMUSCULAR
  Filled 2012-05-13 (×2): qty 1

## 2012-05-13 NOTE — ED Notes (Signed)
Pt refused pain medication at this time. This nurse told him if his pain became to intense to call for me and i would bring him back the medication
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
## 2012-05-15 ENCOUNTER — Other Ambulatory Visit: Payer: Self-pay | Admitting: Internal Medicine

## 2012-06-04 ENCOUNTER — Telehealth: Payer: Self-pay | Admitting: *Deleted

## 2012-06-04 MED ORDER — OXYCODONE HCL 10 MG PO TB12: 10.0000 mg | ORAL_TABLET | Freq: Two times a day (BID) | ORAL | Status: DC

## 2012-06-04 MED ORDER — TADALAFIL 20 MG PO TABS: 20.0000 mg | ORAL_TABLET | Freq: Every day | ORAL | Status: DC | PRN

## 2012-06-04 NOTE — Telephone Encounter (Signed)
Pt requesting refill of Oxycontin and Cialis-Oxycontin last written 04/29/2012 #60 with 0 refills.

## 2012-06-04 NOTE — Telephone Encounter (Signed)
Done hardcopy to robin - both 

## 2012-06-05 NOTE — Telephone Encounter (Signed)
Called the patient left detailed message that hardcopy is ready for pickup at the front desk. 

## 2012-06-06 ENCOUNTER — Ambulatory Visit (INDEPENDENT_AMBULATORY_CARE_PROVIDER_SITE_OTHER)
Admission: RE | Admit: 2012-06-06 | Discharge: 2012-06-06 | Disposition: A | Discharge status: Home / Self Care | Payer: Medicare Other | Source: Ambulatory Visit | Attending: Internal Medicine | Admitting: Internal Medicine

## 2012-06-06 ENCOUNTER — Ambulatory Visit (INDEPENDENT_AMBULATORY_CARE_PROVIDER_SITE_OTHER): Payer: Medicare Other | Admitting: Internal Medicine

## 2012-06-06 ENCOUNTER — Encounter: Payer: Self-pay | Admitting: Internal Medicine

## 2012-06-06 VITALS — BP 120/82 | HR 72 | Temp 98.6°F | Wt 160.0 lb

## 2012-06-06 DIAGNOSIS — G8929 Other chronic pain: Secondary | ICD-10-CM

## 2012-06-06 DIAGNOSIS — I10 Essential (primary) hypertension: Secondary | ICD-10-CM

## 2012-06-06 DIAGNOSIS — K5900 Constipation, unspecified: Secondary | ICD-10-CM

## 2012-06-06 DIAGNOSIS — M549 Dorsalgia, unspecified: Secondary | ICD-10-CM

## 2012-06-06 MED ORDER — OXYCODONE HCL 20 MG PO TB12: 20.0000 mg | ORAL_TABLET | Freq: Two times a day (BID) | ORAL | Status: DC

## 2012-06-06 MED ORDER — AMITRIPTYLINE HCL 100 MG PO TABS: ORAL_TABLET | ORAL | Status: DC

## 2012-06-06 NOTE — Assessment & Plan Note (Signed)
To cont senakot/miralax,  to f/u any worsening symptoms or concerns

## 2012-06-06 NOTE — Assessment & Plan Note (Signed)
stable overall by history and exam, recent data reviewed with pt, and pt to continue medical treatment as before,  to f/u any worsening symptoms or concerns BP Readings from Last 3 Encounters:  06/06/12 120/82  05/13/12 109/63  02/25/12 110/78

## 2012-06-06 NOTE — Patient Instructions (Addendum)
OK to increase the elavil as prescribed Please continue all other medications as before, and refills have been done if requested - such as the oxycontin at 20 mg twice per day Please have the pharmacy call with any other refills you may need. Please remember to sign up for My Chart if you have not done so, as this will be important to you in the future with finding out test results, communicating by private email, and scheduling acute appointments online when needed.
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
## 2012-06-06 NOTE — Assessment & Plan Note (Addendum)
For oxycontin asd,  to f/u any worsening symptoms or concerns, also for elavil bid prn

## 2012-06-30 ENCOUNTER — Other Ambulatory Visit: Payer: Self-pay | Admitting: Internal Medicine

## 2012-07-01 ENCOUNTER — Encounter: Payer: Self-pay | Admitting: Internal Medicine

## 2012-07-01 ENCOUNTER — Telehealth: Payer: Self-pay | Admitting: Internal Medicine

## 2012-07-01 MED ORDER — HYDROCODONE-ACETAMINOPHEN 7.5-325 MG PO TABS
1.0000 | ORAL_TABLET | Freq: Four times a day (QID) | ORAL | Status: DC | PRN
Start: 2012-07-01 — End: 2012-09-30

## 2012-07-01 NOTE — Telephone Encounter (Signed)
We can try to decrease to 10 mg bid with next rx if he wants to try this

## 2012-07-01 NOTE — Telephone Encounter (Signed)
Called the patient and he stated he would like an alternative other than oxycodone at all.  He does not like the way it makes him feel and would like something other than any oxycodone.

## 2012-07-01 NOTE — Telephone Encounter (Signed)
Oxycodone is making him sleep too much.  Is there something else that will work but not cause him to sleep.

## 2012-07-01 NOTE — Telephone Encounter (Signed)
Ok to try change to hydrocodone- Done hardcopy to robin

## 2012-07-01 NOTE — Telephone Encounter (Signed)
Called left message to call back 

## 2012-07-02 ENCOUNTER — Telehealth: Payer: Self-pay | Admitting: Internal Medicine

## 2012-07-02 NOTE — Telephone Encounter (Signed)
Dismissal Letter sent by Certified Mail 07/02/2012  Documentation    Katheran James Flowers at 07/10/2012 8:19 AM    Status: Signed             Received signed return receipt verifying delivery of dismissal letter. rmf 07/10/2012.

## 2012-07-02 NOTE — Telephone Encounter (Signed)
Called the patient left message to call back 

## 2012-07-02 NOTE — Telephone Encounter (Signed)
Called left message to call back 

## 2012-07-03 NOTE — Telephone Encounter (Signed)
Called left message to call back 

## 2012-07-03 NOTE — Telephone Encounter (Signed)
Called the patient left a detailed message that hardcopy has been faxed to [employer]. [place].  Have called the patient several days and left messages but patient has not returned my call.  Will close the note.

## 2012-07-10 NOTE — Telephone Encounter (Signed)
Received signed return receipt verifying delivery of dismissal letter. rmf 07/10/2012.

## 2012-07-17 ENCOUNTER — Telehealth: Payer: Self-pay

## 2012-07-17 MED ORDER — ATENOLOL 50 MG PO TABS: 50.0000 mg | ORAL_TABLET | Freq: Two times a day (BID) | ORAL | Status: DC

## 2012-07-17 NOTE — Telephone Encounter (Signed)
refill 

## 2012-08-19 ENCOUNTER — Other Ambulatory Visit: Payer: Self-pay | Admitting: Internal Medicine

## 2012-08-20 ENCOUNTER — Telehealth: Payer: Self-pay | Admitting: *Deleted

## 2012-08-20 NOTE — Telephone Encounter (Signed)
A user error has taken place.

## 2012-08-27 ENCOUNTER — Other Ambulatory Visit: Payer: Self-pay | Admitting: Internal Medicine

## 2012-09-15 ENCOUNTER — Other Ambulatory Visit: Payer: Self-pay | Admitting: Internal Medicine

## 2012-09-25 ENCOUNTER — Other Ambulatory Visit: Payer: Self-pay | Admitting: Internal Medicine

## 2012-09-30 ENCOUNTER — Ambulatory Visit (HOSPITAL_BASED_OUTPATIENT_CLINIC_OR_DEPARTMENT_OTHER): Payer: Medicare Other | Admitting: Physical Medicine & Rehabilitation

## 2012-09-30 ENCOUNTER — Encounter: Payer: Self-pay | Admitting: Physical Medicine & Rehabilitation

## 2012-09-30 ENCOUNTER — Encounter: Payer: Medicare Other | Attending: Physical Medicine & Rehabilitation

## 2012-09-30 VITALS — BP 129/104 | HR 88 | Resp 14 | Ht 70.0 in | Wt 165.0 lb

## 2012-09-30 DIAGNOSIS — N319 Neuromuscular dysfunction of bladder, unspecified: Secondary | ICD-10-CM | POA: Insufficient documentation

## 2012-09-30 DIAGNOSIS — S34109A Unspecified injury to unspecified level of lumbar spinal cord, initial encounter: Secondary | ICD-10-CM | POA: Insufficient documentation

## 2012-09-30 DIAGNOSIS — G8220 Paraplegia, unspecified: Secondary | ICD-10-CM | POA: Insufficient documentation

## 2012-09-30 DIAGNOSIS — G959 Disease of spinal cord, unspecified: Secondary | ICD-10-CM | POA: Insufficient documentation

## 2012-09-30 DIAGNOSIS — K592 Neurogenic bowel, not elsewhere classified: Secondary | ICD-10-CM | POA: Insufficient documentation

## 2012-09-30 DIAGNOSIS — G9581 Conus medullaris syndrome: Secondary | ICD-10-CM | POA: Insufficient documentation

## 2012-09-30 DIAGNOSIS — M5380 Other specified dorsopathies, site unspecified: Secondary | ICD-10-CM | POA: Insufficient documentation

## 2012-09-30 DIAGNOSIS — X58XXXA Exposure to other specified factors, initial encounter: Secondary | ICD-10-CM | POA: Insufficient documentation

## 2012-09-30 MED ORDER — AMITRIPTYLINE HCL 100 MG PO TABS: ORAL_TABLET | ORAL | Status: DC

## 2012-09-30 MED ORDER — PREGABALIN 150 MG PO CAPS: 150.0000 mg | ORAL_CAPSULE | Freq: Every day | ORAL | Status: DC

## 2012-09-30 NOTE — Patient Instructions (Addendum)
OnabotulinumtoxinA injection (Medical Use) What is this medicine? ONABOTULINUMTOXINA is a neuro-muscular blocker. This medicine is used to treat crossed eyes, eyelid spasms, severe neck muscle spasms, and elbow, wrist, and finger muscle spasms. It is also used to treat excessive underarm sweating, to prevent chronic migraine headaches, and to treat loss of bladder control due to neurologic conditions such as multiple sclerosis or spinal cord injury. This medicine may be used for other purposes; ask your health care provider or pharmacist if you have questions. What should I tell my health care provider before I take this medicine? They need to know if you have any of these conditions: -breathing problems -cerebral palsy spasms -difficulty urinating -heart problems -history of surgery where this medicine is going to be used -infection at the site where this medicine is going to be used -myasthenia gravis or other neurologic disease -nerve or muscle disease -surgery plans -take medicines that treat or prevent blood clots    We would use this to control back muscle spasms related to your spinal cord injury -thyroid problems -an unusual or allergic reaction to botulinum toxin, albumin, other medicines, foods, dyes, or preservatives -pregnant or trying to get pregnant -breast-feeding How should I use this medicine? This medicine is for injection into a muscle. It is given by a health care professional in a hospital or clinic setting. Talk to your pediatrician regarding the use of this medicine in children. While this drug may be prescribed for children as young as 42 years old for selected conditions, precautions do apply. Overdosage: If you think you have taken too much of this medicine contact a poison control center or emergency room at once. NOTE: This medicine is only for you. Do not share this medicine with others. What if I miss a dose? This does not apply. What may interact with  this medicine? -aminoglycoside antibiotics like gentamicin, neomycin, tobramycin -muscle relaxants -other botulinum toxin injections This list may not describe all possible interactions. Give your health care provider a list of all the medicines, herbs, non-prescription drugs, or dietary supplements you use. Also tell them if you smoke, drink alcohol, or use illegal drugs. Some items may interact with your medicine. What should I watch for while using this medicine? Visit your doctor for regular check ups. This medicine will cause weakness in the muscle where it is injected. Tell your doctor if you feel unusually weak in other muscles. Get medical help right away if you have problems with breathing, swallowing, or talking. This medicine might make your eyelids droop or make you see blurry or double. If you have weak muscles or trouble seeing do not drive a car, use machinery, or do other dangerous activities. This medicine contains albumin from human blood. It may be possible to pass an infection in this medicine, but no cases have been reported. Talk to your doctor about the risks and benefits of this medicine. If your activities have been limited by your condition, go back to your regular routine slowly after treatment with this medicine. What side effects may I notice from receiving this medicine? Side effects that you should report to your doctor or health care professional as soon as possible: -allergic reactions like skin rash, itching or hives, swelling of the face, lips, or tongue -breathing problems -changes in vision -chest pain or tightness -eye irritation, pain -fast, irregular heartbeat -infection -numbness -speech problems -swallowing problems -unusual weakness Side effects that usually do not require medical attention (report to your doctor or health care  professional if they continue or are bothersome): -bruising or pain at site where injected -drooping eyelid -dry eyes or  mouth -headache -muscles aches, pains -sensitivity to light -tearing This list may not describe all possible side effects. Call your doctor for medical advice about side effects. You may report side effects to FDA at 1-800-FDA-1088. Where should I keep my medicine? This drug is given in a hospital or clinic and will not be stored at home. NOTE: This sheet is a summary. It may not cover all possible information. If you have questions about this medicine, talk to your doctor, pharmacist, or health care provider.  2013, [employer]. (11/22/2009 8:06:56 AM)
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
## 2012-10-14 ENCOUNTER — Encounter (HOSPITAL_COMMUNITY): Payer: Self-pay | Admitting: Emergency Medicine

## 2012-10-14 ENCOUNTER — Emergency Department (HOSPITAL_COMMUNITY)
Admission: EM | Admit: 2012-10-14 | Discharge: 2012-10-14 | Disposition: A | Discharge status: Home / Self Care | Payer: Medicare Other | Attending: Emergency Medicine | Admitting: Emergency Medicine

## 2012-10-14 DIAGNOSIS — Z8669 Personal history of other diseases of the nervous system and sense organs: Secondary | ICD-10-CM | POA: Insufficient documentation

## 2012-10-14 DIAGNOSIS — I10 Essential (primary) hypertension: Secondary | ICD-10-CM | POA: Insufficient documentation

## 2012-10-14 DIAGNOSIS — F3289 Other specified depressive episodes: Secondary | ICD-10-CM | POA: Insufficient documentation

## 2012-10-14 DIAGNOSIS — Z8639 Personal history of other endocrine, nutritional and metabolic disease: Secondary | ICD-10-CM | POA: Insufficient documentation

## 2012-10-14 DIAGNOSIS — Z79899 Other long term (current) drug therapy: Secondary | ICD-10-CM | POA: Insufficient documentation

## 2012-10-14 DIAGNOSIS — Z8614 Personal history of Methicillin resistant Staphylococcus aureus infection: Secondary | ICD-10-CM | POA: Insufficient documentation

## 2012-10-14 DIAGNOSIS — F329 Major depressive disorder, single episode, unspecified: Secondary | ICD-10-CM | POA: Insufficient documentation

## 2012-10-14 DIAGNOSIS — Z8719 Personal history of other diseases of the digestive system: Secondary | ICD-10-CM | POA: Insufficient documentation

## 2012-10-14 DIAGNOSIS — Z862 Personal history of diseases of the blood and blood-forming organs and certain disorders involving the immune mechanism: Secondary | ICD-10-CM | POA: Insufficient documentation

## 2012-10-14 DIAGNOSIS — Z760 Encounter for issue of repeat prescription: Secondary | ICD-10-CM | POA: Insufficient documentation

## 2012-10-14 DIAGNOSIS — Z8739 Personal history of other diseases of the musculoskeletal system and connective tissue: Secondary | ICD-10-CM | POA: Insufficient documentation

## 2012-10-14 DIAGNOSIS — Z8619 Personal history of other infectious and parasitic diseases: Secondary | ICD-10-CM | POA: Insufficient documentation

## 2012-10-14 DIAGNOSIS — Z87440 Personal history of urinary (tract) infections: Secondary | ICD-10-CM | POA: Insufficient documentation

## 2012-10-14 MED ORDER — ATENOLOL 25 MG PO TABS: 25.0000 mg | ORAL_TABLET | Freq: Every day | ORAL | Status: DC
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
## 2012-10-14 NOTE — ED Provider Notes (Signed)
Medical screening examination/treatment/procedure(s) were performed by non-physician practitioner and as supervising physician I was immediately available for consultation/collaboration.   Glynn Octave, MD 10/14/12 [phone]
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
## 2012-10-14 NOTE — ED Notes (Signed)
Pt reports being out of atenolol x 1 month dt switching PCP. States he has had dizziness at night x 3 weeks. Denies syncope but states "I have just felt lightheaded when I work out." Neuro intact. Grips equal, strong. AO  X4. Denies pain.

## 2012-10-24 ENCOUNTER — Ambulatory Visit (HOSPITAL_BASED_OUTPATIENT_CLINIC_OR_DEPARTMENT_OTHER): Payer: Medicare Other | Admitting: Physical Medicine & Rehabilitation

## 2012-10-24 ENCOUNTER — Encounter: Payer: Self-pay | Admitting: Physical Medicine & Rehabilitation

## 2012-10-24 VITALS — BP 132/90 | HR 76 | Resp 14 | Ht 70.0 in | Wt 160.0 lb

## 2012-10-24 DIAGNOSIS — M62838 Other muscle spasm: Secondary | ICD-10-CM

## 2012-10-24 NOTE — Progress Notes (Signed)
Botox Injection for spasticity using needle EMG guidance  Dilution: 50Units/ml Indication: Severe spasticity which interferes with ADL,mobility and/or  hygiene and is unresponsive to medication management and other conservative care Informed consent was obtained after describing risks and benefits of the procedure with the patient. This includes bleeding, bruising, infection, excessive weakness, or medication side effects. A REMS form is on file and signed. Needle: 25 gauge 50mm needle electrode Number of units per muscle Right lumbar paraspinal L4 25 Right lumbar paraspinal L5 25 Left lumbar paraspinal L4 25 Right lumbar paraspinal L5 25  All injections were done after obtaining appropriate EMG activity and after negative drawback for blood. The patient tolerated the procedure well. Post procedure instructions were given. A followup appointment was made.

## 2012-10-24 NOTE — Patient Instructions (Signed)

## 2012-12-04 ENCOUNTER — Other Ambulatory Visit: Payer: Self-pay

## 2012-12-05 ENCOUNTER — Ambulatory Visit: Payer: Medicare Other | Admitting: Physical Medicine & Rehabilitation

## 2012-12-08 ENCOUNTER — Ambulatory Visit (HOSPITAL_BASED_OUTPATIENT_CLINIC_OR_DEPARTMENT_OTHER): Payer: Medicare Other | Admitting: Physical Medicine & Rehabilitation

## 2012-12-08 ENCOUNTER — Encounter: Payer: Medicare Other | Attending: Physical Medicine & Rehabilitation

## 2012-12-08 ENCOUNTER — Encounter: Payer: Self-pay | Admitting: Physical Medicine & Rehabilitation

## 2012-12-08 VITALS — BP 143/92 | HR 60 | Resp 14 | Ht 70.0 in | Wt 160.0 lb

## 2012-12-08 DIAGNOSIS — K592 Neurogenic bowel, not elsewhere classified: Secondary | ICD-10-CM

## 2012-12-08 DIAGNOSIS — G8220 Paraplegia, unspecified: Secondary | ICD-10-CM | POA: Insufficient documentation

## 2012-12-08 DIAGNOSIS — S34109A Unspecified injury to unspecified level of lumbar spinal cord, initial encounter: Secondary | ICD-10-CM | POA: Insufficient documentation

## 2012-12-08 DIAGNOSIS — X58XXXA Exposure to other specified factors, initial encounter: Secondary | ICD-10-CM | POA: Insufficient documentation

## 2012-12-08 DIAGNOSIS — N319 Neuromuscular dysfunction of bladder, unspecified: Secondary | ICD-10-CM | POA: Insufficient documentation

## 2012-12-08 DIAGNOSIS — M5380 Other specified dorsopathies, site unspecified: Secondary | ICD-10-CM | POA: Insufficient documentation

## 2012-12-08 DIAGNOSIS — G834 Cauda equina syndrome: Secondary | ICD-10-CM

## 2012-12-08 DIAGNOSIS — G959 Disease of spinal cord, unspecified: Secondary | ICD-10-CM | POA: Insufficient documentation

## 2012-12-08 MED ORDER — AMITRIPTYLINE HCL 150 MG PO TABS: 150.0000 mg | ORAL_TABLET | Freq: Every day | ORAL | Status: DC

## 2012-12-08 MED ORDER — PREGABALIN 75 MG PO CAPS: 75.0000 mg | ORAL_CAPSULE | Freq: Two times a day (BID) | ORAL | Status: DC

## 2012-12-08 NOTE — Patient Instructions (Signed)
Please keep a journal of your pain.  Will restart Lyrica 75 mg twice a day but we may need to increase the dose  May continue to Aleve 400 mg twice a day take with food this make stomach upset  Continue amitriptyline one 150 mg at night
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
## 2013-01-06 ENCOUNTER — Encounter: Payer: Medicare Other | Attending: Physical Medicine & Rehabilitation

## 2013-01-06 ENCOUNTER — Ambulatory Visit: Payer: Medicare Other | Admitting: Physical Medicine & Rehabilitation

## 2013-01-06 DIAGNOSIS — N319 Neuromuscular dysfunction of bladder, unspecified: Secondary | ICD-10-CM | POA: Insufficient documentation

## 2013-01-06 DIAGNOSIS — X58XXXA Exposure to other specified factors, initial encounter: Secondary | ICD-10-CM | POA: Insufficient documentation

## 2013-01-06 DIAGNOSIS — G959 Disease of spinal cord, unspecified: Secondary | ICD-10-CM | POA: Insufficient documentation

## 2013-01-06 DIAGNOSIS — M5380 Other specified dorsopathies, site unspecified: Secondary | ICD-10-CM | POA: Insufficient documentation

## 2013-01-06 DIAGNOSIS — S34109A Unspecified injury to unspecified level of lumbar spinal cord, initial encounter: Secondary | ICD-10-CM | POA: Insufficient documentation

## 2013-01-06 DIAGNOSIS — G8220 Paraplegia, unspecified: Secondary | ICD-10-CM | POA: Insufficient documentation

## 2013-01-06 DIAGNOSIS — K592 Neurogenic bowel, not elsewhere classified: Secondary | ICD-10-CM | POA: Insufficient documentation

## 2013-02-19 ENCOUNTER — Ambulatory Visit: Payer: Medicare Other | Attending: Family Medicine | Admitting: Physical Therapy

## 2013-02-19 DIAGNOSIS — IMO0001 Reserved for inherently not codable concepts without codable children: Secondary | ICD-10-CM | POA: Insufficient documentation

## 2013-02-19 DIAGNOSIS — M6281 Muscle weakness (generalized): Secondary | ICD-10-CM | POA: Insufficient documentation

## 2013-02-19 DIAGNOSIS — Z993 Dependence on wheelchair: Secondary | ICD-10-CM | POA: Insufficient documentation

## 2013-04-23 ENCOUNTER — Ambulatory Visit: Payer: Medicare Other | Admitting: Gastroenterology

## 2013-05-01 ENCOUNTER — Other Ambulatory Visit: Payer: Self-pay | Admitting: Physical Medicine & Rehabilitation

## 2013-05-03 ENCOUNTER — Emergency Department (HOSPITAL_COMMUNITY)
Admission: EM | Admit: 2013-05-03 | Discharge: 2013-05-03 | Disposition: A | Discharge status: Home / Self Care | Payer: Medicare Other | Attending: Emergency Medicine | Admitting: Emergency Medicine

## 2013-05-03 ENCOUNTER — Encounter (HOSPITAL_COMMUNITY): Payer: Self-pay | Admitting: Emergency Medicine

## 2013-05-03 DIAGNOSIS — I10 Essential (primary) hypertension: Secondary | ICD-10-CM | POA: Insufficient documentation

## 2013-05-03 DIAGNOSIS — Z87440 Personal history of urinary (tract) infections: Secondary | ICD-10-CM | POA: Insufficient documentation

## 2013-05-03 DIAGNOSIS — F329 Major depressive disorder, single episode, unspecified: Secondary | ICD-10-CM | POA: Insufficient documentation

## 2013-05-03 DIAGNOSIS — Z862 Personal history of diseases of the blood and blood-forming organs and certain disorders involving the immune mechanism: Secondary | ICD-10-CM | POA: Insufficient documentation

## 2013-05-03 DIAGNOSIS — Z8639 Personal history of other endocrine, nutritional and metabolic disease: Secondary | ICD-10-CM | POA: Insufficient documentation

## 2013-05-03 DIAGNOSIS — Z87828 Personal history of other (healed) physical injury and trauma: Secondary | ICD-10-CM | POA: Insufficient documentation

## 2013-05-03 DIAGNOSIS — Z993 Dependence on wheelchair: Secondary | ICD-10-CM | POA: Insufficient documentation

## 2013-05-03 DIAGNOSIS — F3289 Other specified depressive episodes: Secondary | ICD-10-CM | POA: Insufficient documentation

## 2013-05-03 DIAGNOSIS — G8220 Paraplegia, unspecified: Secondary | ICD-10-CM | POA: Insufficient documentation

## 2013-05-03 DIAGNOSIS — Z8614 Personal history of Methicillin resistant Staphylococcus aureus infection: Secondary | ICD-10-CM | POA: Insufficient documentation

## 2013-05-03 DIAGNOSIS — Z79899 Other long term (current) drug therapy: Secondary | ICD-10-CM | POA: Insufficient documentation

## 2013-05-03 DIAGNOSIS — H43399 Other vitreous opacities, unspecified eye: Secondary | ICD-10-CM | POA: Insufficient documentation

## 2013-05-03 DIAGNOSIS — Z8619 Personal history of other infectious and parasitic diseases: Secondary | ICD-10-CM | POA: Insufficient documentation

## 2013-05-03 NOTE — ED Notes (Signed)
Pt states went to see PCP Dr. Daphine DeutscherMartin on Tuesday and she changed him from Atenolol to a new BP med.  BP were running high in the 140's, this am it was 167/109.
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
## 2013-05-03 NOTE — Discharge Instructions (Signed)
Not every illness or injury can be identified during an emergency department visit, thus follow-up with your primary healthcare provider is important. Medical conditions can also worsen, so it is also important to return immediately as directed below, or if you have other serious concerns develop. °RETURN IMMEDIATELY IF you develop new shortness of breath, chest pain, fever, have difficulty moving parts of your body (new weakness, numbness, or incoordination), sudden change in speech, vision, swallowing, or understanding, faint or develop new dizziness, severe headache, become poorly responsive or have an altered mental status compared to baseline for you, new rash, abdominal pain, or bloody stools,  °Return sooner also if you develop new problems for which you have not talked to your caregiver but you feel may be emergency medical conditions, or are unable to be cared for safely at home. °

## 2013-05-20 ENCOUNTER — Other Ambulatory Visit: Payer: Self-pay | Admitting: Physical Medicine & Rehabilitation

## 2013-08-05 ENCOUNTER — Other Ambulatory Visit: Payer: Self-pay | Admitting: Physical Medicine & Rehabilitation

## 2013-12-27 ENCOUNTER — Other Ambulatory Visit: Payer: Self-pay | Admitting: Physical Medicine & Rehabilitation

## 2014-01-02 ENCOUNTER — Emergency Department (HOSPITAL_COMMUNITY)
Admission: EM | Admit: 2014-01-02 | Discharge: 2014-01-02 | Disposition: A | Discharge status: Home / Self Care | Payer: Private Health Insurance - Indemnity | Attending: Emergency Medicine | Admitting: Emergency Medicine

## 2014-01-02 ENCOUNTER — Encounter (HOSPITAL_COMMUNITY): Payer: Self-pay | Admitting: *Deleted

## 2014-01-02 DIAGNOSIS — I10 Essential (primary) hypertension: Secondary | ICD-10-CM | POA: Insufficient documentation

## 2014-01-02 DIAGNOSIS — N528 Other male erectile dysfunction: Secondary | ICD-10-CM | POA: Diagnosis not present

## 2014-01-02 DIAGNOSIS — Z8614 Personal history of Methicillin resistant Staphylococcus aureus infection: Secondary | ICD-10-CM | POA: Diagnosis not present

## 2014-01-02 DIAGNOSIS — G8220 Paraplegia, unspecified: Secondary | ICD-10-CM | POA: Diagnosis not present

## 2014-01-02 DIAGNOSIS — Z792 Long term (current) use of antibiotics: Secondary | ICD-10-CM | POA: Insufficient documentation

## 2014-01-02 DIAGNOSIS — Z8639 Personal history of other endocrine, nutritional and metabolic disease: Secondary | ICD-10-CM | POA: Diagnosis not present

## 2014-01-02 DIAGNOSIS — Z79899 Other long term (current) drug therapy: Secondary | ICD-10-CM | POA: Insufficient documentation

## 2014-01-02 DIAGNOSIS — Z862 Personal history of diseases of the blood and blood-forming organs and certain disorders involving the immune mechanism: Secondary | ICD-10-CM | POA: Diagnosis not present

## 2014-01-02 DIAGNOSIS — F329 Major depressive disorder, single episode, unspecified: Secondary | ICD-10-CM | POA: Diagnosis not present

## 2014-01-02 DIAGNOSIS — Z8739 Personal history of other diseases of the musculoskeletal system and connective tissue: Secondary | ICD-10-CM | POA: Insufficient documentation

## 2014-01-02 DIAGNOSIS — R2240 Localized swelling, mass and lump, unspecified lower limb: Secondary | ICD-10-CM | POA: Diagnosis present

## 2014-01-02 DIAGNOSIS — Z87440 Personal history of urinary (tract) infections: Secondary | ICD-10-CM | POA: Insufficient documentation

## 2014-01-02 DIAGNOSIS — Z8719 Personal history of other diseases of the digestive system: Secondary | ICD-10-CM | POA: Insufficient documentation

## 2014-01-02 DIAGNOSIS — N4830 Priapism, unspecified: Secondary | ICD-10-CM

## 2014-01-02 DIAGNOSIS — Z87828 Personal history of other (healed) physical injury and trauma: Secondary | ICD-10-CM | POA: Insufficient documentation

## 2014-01-02 NOTE — ED Notes (Signed)
The pt has had an erection for several hours after getting some type med ???? Unsure of the med he is speaking of.  Since he arrived after he had used ice for several hours his penis is now going back to normal.  He has had no pain

## 2014-01-02 NOTE — Discharge Instructions (Signed)
Stop using the injection and cialis for now until you talk to your urologist.   Discuss with urology about starting it at a much lower dose.   Return to ER if you have prolonged erection, unable to urinate.

## 2014-01-02 NOTE — ED Notes (Signed)
The pt has had this erection for 24 hours before he came here.  He is w/c bound  ?  Paraplegic
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
## 2014-01-02 NOTE — ED Notes (Signed)
Cannot triGE STILL IN RESTROOM

## 2014-03-16 ENCOUNTER — Encounter (HOSPITAL_COMMUNITY): Payer: Self-pay | Admitting: Emergency Medicine

## 2014-03-16 ENCOUNTER — Emergency Department (HOSPITAL_COMMUNITY): Payer: Medicare Other

## 2014-03-16 ENCOUNTER — Emergency Department (HOSPITAL_COMMUNITY)
Admission: EM | Admit: 2014-03-16 | Discharge: 2014-03-16 | Disposition: A | Discharge status: Home / Self Care | Payer: Medicare Other | Attending: Emergency Medicine | Admitting: Emergency Medicine

## 2014-03-16 DIAGNOSIS — Z8614 Personal history of Methicillin resistant Staphylococcus aureus infection: Secondary | ICD-10-CM | POA: Diagnosis not present

## 2014-03-16 DIAGNOSIS — Z8739 Personal history of other diseases of the musculoskeletal system and connective tissue: Secondary | ICD-10-CM | POA: Insufficient documentation

## 2014-03-16 DIAGNOSIS — N390 Urinary tract infection, site not specified: Secondary | ICD-10-CM

## 2014-03-16 DIAGNOSIS — Z79899 Other long term (current) drug therapy: Secondary | ICD-10-CM | POA: Insufficient documentation

## 2014-03-16 DIAGNOSIS — R369 Urethral discharge, unspecified: Secondary | ICD-10-CM | POA: Insufficient documentation

## 2014-03-16 DIAGNOSIS — R109 Unspecified abdominal pain: Secondary | ICD-10-CM

## 2014-03-16 DIAGNOSIS — R300 Dysuria: Secondary | ICD-10-CM

## 2014-03-16 DIAGNOSIS — Z8669 Personal history of other diseases of the nervous system and sense organs: Secondary | ICD-10-CM | POA: Diagnosis not present

## 2014-03-16 DIAGNOSIS — I10 Essential (primary) hypertension: Secondary | ICD-10-CM | POA: Insufficient documentation

## 2014-03-16 DIAGNOSIS — F329 Major depressive disorder, single episode, unspecified: Secondary | ICD-10-CM | POA: Diagnosis not present

## 2014-03-16 DIAGNOSIS — Z8639 Personal history of other endocrine, nutritional and metabolic disease: Secondary | ICD-10-CM | POA: Insufficient documentation

## 2014-03-16 DIAGNOSIS — Z9089 Acquired absence of other organs: Secondary | ICD-10-CM | POA: Insufficient documentation

## 2014-03-16 DIAGNOSIS — K644 Residual hemorrhoidal skin tags: Secondary | ICD-10-CM | POA: Diagnosis not present

## 2014-03-16 DIAGNOSIS — Z792 Long term (current) use of antibiotics: Secondary | ICD-10-CM | POA: Insufficient documentation

## 2014-03-16 LAB — COMPREHENSIVE METABOLIC PANEL
ALK PHOS: 96 U/L (ref 39–117)
ALT: 34 U/L (ref 0–53)
AST: 34 U/L (ref 0–37)
Albumin: 4 g/dL (ref 3.5–5.2)
Anion gap: 12 (ref 5–15)
BILIRUBIN TOTAL: 1 mg/dL (ref 0.3–1.2)
BUN: 10 mg/dL (ref 6–23)
CHLORIDE: 101 mmol/L (ref 96–112)
CO2: 25 mmol/L (ref 19–32)
CREATININE: 0.58 mg/dL (ref 0.50–1.35)
Calcium: 9.5 mg/dL (ref 8.4–10.5)
GFR calc Af Amer: >90 mL/min (ref >90)
Glucose, Bld: 80 mg/dL (ref 70–99)
POTASSIUM: 3.8 mmol/L (ref 3.5–5.1)
Sodium: 138 mmol/L (ref 135–145)
Total Protein: 8.1 g/dL (ref 6.0–8.3)

## 2014-03-16 LAB — URINE MICROSCOPIC-ADD ON

## 2014-03-16 LAB — URINALYSIS, ROUTINE W REFLEX MICROSCOPIC
Bilirubin Urine: NEGATIVE
GLUCOSE, UA: NEGATIVE mg/dL
Ketones, ur: NEGATIVE mg/dL
Nitrite: POSITIVE — AB
PH: 5.5 (ref 5.0–8.0)
PROTEIN: NEGATIVE mg/dL
Specific Gravity, Urine: 1.014 (ref 1.005–1.030)
Urobilinogen, UA: 0.2 mg/dL (ref 0.0–1.0)

## 2014-03-16 LAB — CBC WITH DIFFERENTIAL/PLATELET
BASOS PCT: 1 % (ref 0–1)
Basophils Absolute: 0.1 10*3/uL (ref 0.0–0.1)
EOS PCT: 2 % (ref 0–5)
Eosinophils Absolute: 0.1 10*3/uL (ref 0.0–0.7)
HEMATOCRIT: 47.1 % (ref 39.0–52.0)
HEMOGLOBIN: 15.8 g/dL (ref 13.0–17.0)
LYMPHS PCT: 37 % (ref 12–46)
Lymphs Abs: 1.9 10*3/uL (ref 0.7–4.0)
MCH: 30.3 pg (ref 26.0–34.0)
MCHC: 33.5 g/dL (ref 30.0–36.0)
MCV: 90.2 fL (ref 78.0–100.0)
MONOS PCT: 6 % (ref 3–12)
Monocytes Absolute: 0.3 10*3/uL (ref 0.1–1.0)
NEUTROS ABS: 2.7 10*3/uL (ref 1.7–7.7)
Neutrophils Relative %: 54 % (ref 43–77)
Platelets: ADEQUATE 10*3/uL (ref 150–400)
RBC: 5.22 MIL/uL (ref 4.22–5.81)
RDW: 13.6 % (ref 11.5–15.5)
Smear Review: ADEQUATE
WBC: 5.1 10*3/uL (ref 4.0–10.5)

## 2014-03-16 LAB — POC OCCULT BLOOD, ED: FECAL OCCULT BLD: NEGATIVE

## 2014-03-16 MED ORDER — DEXTROSE 5 % IV SOLN
1.0000 g | Freq: Once | INTRAVENOUS | Status: AC
  Administered 2014-03-16: 1 g via INTRAVENOUS
  Filled 2014-03-16: qty 10

## 2014-03-16 MED ORDER — AZITHROMYCIN 250 MG PO TABS
1000.0000 mg | ORAL_TABLET | Freq: Once | ORAL | Status: AC
Start: 2014-03-16 — End: 2014-03-16
  Administered 2014-03-16: 1000 mg via ORAL
  Filled 2014-03-16: qty 4

## 2014-03-16 MED ORDER — CEPHALEXIN 500 MG PO CAPS: ORAL_CAPSULE | ORAL | Status: DC

## 2014-03-16 NOTE — ED Notes (Signed)
NAD at this time. Pt is stable and going home on the bus.   

## 2014-03-16 NOTE — Discharge Instructions (Signed)
Stay very well hydrated with plenty of water throughout the day. Take antibiotic until completed. Use tylenol and motrin for pain. Follow up with your urologist in 1 week for recheck of ongoing symptoms but return to ER for emergent changing or worsening of symptoms. You have been treated for gonorrhea and chlamydia in the ER but the hospital will call you if lab is positive. You were tested for HIV and Syphilis, and the hospital will call you if the lab is positive.  Please seek immediate care if you develop the following: You develop back pain.  Your symptoms are no better, or worse in 3 days. There is severe back pain or lower abdominal pain.  You develop chills.  You have a fever.  There is nausea or vomiting.  There is continued burning or discomfort with urination.     Urinary Tract Infection A urinary tract infection (UTI) can occur any place along the urinary tract. The tract includes the kidneys, ureters, bladder, and urethra. A type of germ called bacteria often causes a UTI. UTIs are often helped with antibiotic medicine.  HOME CARE   If given, take antibiotics as told by your doctor. Finish them even if you start to feel better.  Drink enough fluids to keep your pee (urine) clear or pale yellow.  Avoid tea, drinks with caffeine, and bubbly (carbonated) drinks.  Pee often. Avoid holding your pee in for a long time.  Pee before and after having sex (intercourse).  Wipe from front to back after you poop (bowel movement) if you are a woman. Use each tissue only once. GET HELP RIGHT AWAY IF:   You have back pain.  You have lower belly (abdominal) pain.  You have chills.  You feel sick to your stomach (nauseous).  You throw up (vomit).  Your burning or discomfort with peeing does not go away.  You have a fever.  Your symptoms are not better in 3 days. MAKE SURE YOU:   Understand these instructions.  Will watch your condition.  Will get help right away if you are  not doing well or get worse. Document Released: 07/04/2007 Document Revised: 10/10/2011 Document Reviewed: 08/16/2011 [facility] Patient Information 2015 [place], [place]. This information is not intended to replace advice given to you by your health care provider. Make sure you discuss any questions you have with your health care provider.  Flank Pain Flank pain is pain in your side. The flank is the area of your side between your upper belly (abdomen) and your back. Pain in this area can be caused by many different things. HOME CARE Home care and treatment will depend on the cause of your pain.  Rest as told by your doctor.  Drink enough fluids to keep your pee (urine) clear or pale yellow.  Only take medicine as told by your doctor.  Tell your doctor about any changes in your pain.  Follow up with your doctor. GET HELP RIGHT AWAY IF:   Your pain does not get better with medicine.   You have new symptoms or your symptoms get worse.  Your pain gets worse.   You have belly (abdominal) pain.   You are short of breath.   You always feel sick to your stomach (nauseous).   You keep throwing up (vomiting).   You have puffiness (swelling) in your belly.   You feel light-headed or you pass out (faint).   You have blood in your pee.  You have a fever or lasting  symptoms for more than 2-3 days.  You have a fever and your symptoms suddenly get worse. MAKE SURE YOU:   Understand these instructions.  Will watch your condition.  Will get help right away if you are not doing well or get worse. Document Released: 10/25/2007 Document Revised: 06/01/2013 Document Reviewed: 08/30/2011 [facility] Patient Information 2015 [place], [place]. This information is not intended to replace advice given to you by your health care provider. Make sure you discuss any questions you have with your health care provider.
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
## 2014-03-16 NOTE — ED Notes (Signed)
Pt here with UTI sx and hematuria; pt self caths

## 2014-03-17 LAB — GC/CHLAMYDIA PROBE AMP (~~LOC~~) NOT AT ARMC
Chlamydia: NEGATIVE
NEISSERIA GONORRHEA: NEGATIVE

## 2014-03-18 LAB — URINE CULTURE: Colony Count: >=100000

## 2014-03-18 LAB — HIV ANTIBODY (ROUTINE TESTING W REFLEX): HIV Screen 4th Generation wRfx: NONREACTIVE

## 2014-03-18 LAB — RPR: RPR: NONREACTIVE

## 2014-03-19 ENCOUNTER — Telehealth (HOSPITAL_BASED_OUTPATIENT_CLINIC_OR_DEPARTMENT_OTHER): Payer: Self-pay | Admitting: Emergency Medicine

## 2014-03-19 NOTE — Telephone Encounter (Signed)
Post ED Visit - Positive Culture Follow-up  Culture report reviewed by antimicrobial stewardship pharmacist: []  Wes Dulaney, Pharm.D., BCPS []  Celedonio MiyamotoJeremy Frens, [address] BCPS []  Georgina PillionElizabeth Martin, Pharm.D., BCPS [x]  [place] IslandMinh Pham, [address] BCPS, AAHIVP []  Estella HuskMichelle Turner, Pharm.D., BCPS, AAHIVP []  Elder CyphersLorie Poole, [address]Ph[address] BCPS  Positive urine culture E. Coli Treated with cephalexin, organism sensitive to the same and no further patient follow-up is required at this time.  Berle MullMiller, Tereza Gilham 03/19/2014, 10:46 AM

## 2014-11-01 ENCOUNTER — Ambulatory Visit: Payer: Medicare Other | Admitting: Gastroenterology

## 2014-11-01 ENCOUNTER — Telehealth: Payer: Self-pay | Admitting: Gastroenterology

## 2014-11-01 NOTE — Telephone Encounter (Signed)
Yes.  This is his 3rd or 4th no show or late cancellation (see phone note regarding this same issue 2015).  He needs to return to the care of his PCP and be referred back to GI if it is still important before we can schedule him another time.

## 2014-11-01 NOTE — Telephone Encounter (Signed)
Please advise-patient had to reschedule appointment for today due to lack of transportation. Do you want to charge?

## 2014-12-28 ENCOUNTER — Ambulatory Visit: Payer: Medicare Other | Admitting: Gastroenterology

## 2015-04-14 ENCOUNTER — Emergency Department (HOSPITAL_COMMUNITY): Payer: Medicare Other

## 2015-04-14 ENCOUNTER — Encounter (HOSPITAL_COMMUNITY): Payer: Self-pay

## 2015-04-14 DIAGNOSIS — Z79891 Long term (current) use of opiate analgesic: Secondary | ICD-10-CM | POA: Insufficient documentation

## 2015-04-14 DIAGNOSIS — Z8719 Personal history of other diseases of the digestive system: Secondary | ICD-10-CM | POA: Insufficient documentation

## 2015-04-14 DIAGNOSIS — I10 Essential (primary) hypertension: Secondary | ICD-10-CM | POA: Diagnosis not present

## 2015-04-14 DIAGNOSIS — Z79899 Other long term (current) drug therapy: Secondary | ICD-10-CM | POA: Insufficient documentation

## 2015-04-14 DIAGNOSIS — M79602 Pain in left arm: Secondary | ICD-10-CM | POA: Diagnosis not present

## 2015-04-14 DIAGNOSIS — Z8669 Personal history of other diseases of the nervous system and sense organs: Secondary | ICD-10-CM | POA: Diagnosis not present

## 2015-04-14 DIAGNOSIS — Z87828 Personal history of other (healed) physical injury and trauma: Secondary | ICD-10-CM | POA: Diagnosis not present

## 2015-04-14 DIAGNOSIS — F329 Major depressive disorder, single episode, unspecified: Secondary | ICD-10-CM | POA: Insufficient documentation

## 2015-04-14 DIAGNOSIS — Z87440 Personal history of urinary (tract) infections: Secondary | ICD-10-CM | POA: Diagnosis not present

## 2015-04-14 DIAGNOSIS — Z8614 Personal history of Methicillin resistant Staphylococcus aureus infection: Secondary | ICD-10-CM | POA: Diagnosis not present

## 2015-04-14 DIAGNOSIS — R079 Chest pain, unspecified: Secondary | ICD-10-CM | POA: Diagnosis present

## 2015-04-14 DIAGNOSIS — Z8639 Personal history of other endocrine, nutritional and metabolic disease: Secondary | ICD-10-CM | POA: Insufficient documentation

## 2015-04-14 DIAGNOSIS — M5412 Radiculopathy, cervical region: Secondary | ICD-10-CM | POA: Insufficient documentation

## 2015-04-14 DIAGNOSIS — R200 Anesthesia of skin: Secondary | ICD-10-CM | POA: Diagnosis not present

## 2015-04-14 LAB — I-STAT TROPONIN, ED: Troponin i, poc: 0 ng/mL (ref 0.00–0.08)

## 2015-04-14 LAB — BASIC METABOLIC PANEL
Anion gap: 14 (ref 5–15)
BUN: 10 mg/dL (ref 6–20)
CHLORIDE: 99 mmol/L — AB (ref 101–111)
CO2: 26 mmol/L (ref 22–32)
CREATININE: 0.54 mg/dL — AB (ref 0.61–1.24)
Calcium: 9.6 mg/dL (ref 8.9–10.3)
GFR calc non Af Amer: >60 mL/min (ref >60)
GLUCOSE: 124 mg/dL — AB (ref 65–99)
Potassium: 4.1 mmol/L (ref 3.5–5.1)
Sodium: 139 mmol/L (ref 135–145)

## 2015-04-14 LAB — CBC
HCT: 42.3 % (ref 39.0–52.0)
HEMOGLOBIN: 13.9 g/dL (ref 13.0–17.0)
MCH: 29.6 pg (ref 26.0–34.0)
MCHC: 32.9 g/dL (ref 30.0–36.0)
MCV: 90.2 fL (ref 78.0–100.0)
PLATELETS: 194 10*3/uL (ref 150–400)
RBC: 4.69 MIL/uL (ref 4.22–5.81)
RDW: 13.6 % (ref 11.5–15.5)
WBC: 4.2 10*3/uL (ref 4.0–10.5)

## 2015-04-14 NOTE — ED Notes (Signed)
Pt reports throbbing left sided CP, onset 5 days ago associated with numbness to left hand. He reports pain is worse when he turns his head to the left. Denies SOB/N/V/D, but he "feels like I cant swallow completely." airway clear, pt talking in complete sentences.

## 2015-04-15 ENCOUNTER — Emergency Department (HOSPITAL_COMMUNITY): Payer: Medicare Other

## 2015-04-15 ENCOUNTER — Encounter (HOSPITAL_COMMUNITY): Payer: Self-pay | Admitting: Radiology

## 2015-04-15 ENCOUNTER — Emergency Department (HOSPITAL_COMMUNITY)
Admission: EM | Admit: 2015-04-15 | Discharge: 2015-04-15 | Disposition: A | Discharge status: Home / Self Care | Payer: Medicare Other | Attending: Emergency Medicine | Admitting: Emergency Medicine

## 2015-04-15 DIAGNOSIS — M5412 Radiculopathy, cervical region: Secondary | ICD-10-CM

## 2015-04-15 DIAGNOSIS — R200 Anesthesia of skin: Secondary | ICD-10-CM

## 2015-04-15 DIAGNOSIS — M79602 Pain in left arm: Secondary | ICD-10-CM

## 2015-04-15 HISTORY — DX: Accidental discharge from unspecified firearms or gun, initial encounter: W34.00XA

## 2015-04-15 LAB — TROPONIN I: Troponin I: <0.03 ng/mL (ref <0.031)

## 2015-04-15 MED ORDER — IOHEXOL 350 MG/ML SOLN
80.0000 mL | Freq: Once | INTRAVENOUS | Status: AC | PRN
  Administered 2015-04-15: 80 mL via INTRAVENOUS

## 2015-04-15 MED ORDER — METHYLPREDNISOLONE 4 MG PO TBPK: ORAL_TABLET | ORAL | Status: DC

## 2015-04-15 NOTE — ED Notes (Signed)
Patient transported to CT 
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
## 2015-04-15 NOTE — Discharge Instructions (Signed)
Cervical Radiculopathy Follow up with your doctor. It may not be possible for you to get an MRI with the metal you have in your body. Return to the ED if you develop new or worsening symptoms. Cervical radiculopathy happens when a nerve in the neck (cervical nerve) is pinched or bruised. This condition can develop because of an injury or as part of the normal aging process. Pressure on the cervical nerves can cause pain or numbness that runs from the neck all the way down into the arm and fingers. Usually, this condition gets better with rest. Treatment may be needed if the condition does not improve.  CAUSES This condition may be caused by:  Injury.  Slipped (herniated) disk.  Muscle tightness in the neck because of overuse.  Arthritis.  Breakdown or degeneration in the bones and joints of the spine (spondylosis) due to aging.  Bone spurs that may develop near the cervical nerves. SYMPTOMS Symptoms of this condition include:  Pain that runs from the neck to the arm and hand. The pain can be severe or irritating. It may be worse when the neck is moved.  Numbness or weakness in the affected arm and hand. DIAGNOSIS This condition may be diagnosed based on symptoms, medical history, and a physical exam. You may also have tests, including:  X-rays.  CT scan.  MRI.  Electromyogram (EMG).  Nerve conduction tests. TREATMENT In many cases, treatment is not needed for this condition. With rest, the condition usually gets better over time. If treatment is needed, options may include:  Wearing a soft neck collar for short periods of time.  Physical therapy to strengthen your neck muscles.  Medicines, such as NSAIDs, oral corticosteroids, or spinal injections.  Surgery. This may be needed if other treatments do not help. Various types of surgery may be done depending on the cause of your problems. HOME CARE INSTRUCTIONS Managing Pain  Take over-the-counter and prescription  medicines only as told by your health care provider.  If directed, apply ice to the affected area.  Put ice in a plastic bag.  Place a towel between your skin and the bag.  Leave the ice on for 20 minutes, 2-3 times per day.  If ice does not help, you can try using heat. Take a warm shower or warm bath, or use a heat pack as told by your health care provider.  Try a gentle neck and shoulder massage to help relieve symptoms. Activity  Rest as needed. Follow instructions from your health care provider about any restrictions on activities.  Do stretching and strengthening exercises as told by your health care provider or physical therapist. General Instructions  If you were given a soft collar, wear it as told by your health care provider.  Use a flat pillow when you sleep.  Keep all follow-up visits as told by your health care provider. This is important. SEEK MEDICAL CARE IF:  Your condition does not improve with treatment. SEEK IMMEDIATE MEDICAL CARE IF:  Your pain gets much worse and cannot be controlled with medicines.  You have weakness or numbness in your hand, arm, face, or leg.  You have a high fever.  You have a stiff, rigid neck.  You lose control of your bowels or your bladder (have incontinence).  You have trouble with walking, balance, or speaking.   This information is not intended to replace advice given to you by your health care provider. Make sure you discuss any questions you have with your  health care provider.   Document Released: 10/10/2000 Document Revised: 10/06/2014 Document Reviewed: 03/11/2014 Elsevier Interactive Patient Education [employer]

## 2015-04-15 NOTE — ED Notes (Signed)
MD at bedside. 
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
## 2015-12-19 ENCOUNTER — Emergency Department (HOSPITAL_COMMUNITY)
Admission: EM | Admit: 2015-12-19 | Discharge: 2015-12-19 | Disposition: A | Discharge status: Home / Self Care | Payer: Medicare Other | Attending: Emergency Medicine | Admitting: Emergency Medicine

## 2015-12-19 ENCOUNTER — Encounter (HOSPITAL_COMMUNITY): Payer: Self-pay | Admitting: Emergency Medicine

## 2015-12-19 DIAGNOSIS — G5622 Lesion of ulnar nerve, left upper limb: Secondary | ICD-10-CM | POA: Diagnosis not present

## 2015-12-19 DIAGNOSIS — M79602 Pain in left arm: Secondary | ICD-10-CM | POA: Diagnosis present

## 2015-12-19 DIAGNOSIS — I10 Essential (primary) hypertension: Secondary | ICD-10-CM | POA: Insufficient documentation

## 2015-12-19 DIAGNOSIS — Z79899 Other long term (current) drug therapy: Secondary | ICD-10-CM | POA: Insufficient documentation

## 2015-12-19 DIAGNOSIS — R202 Paresthesia of skin: Secondary | ICD-10-CM

## 2015-12-19 LAB — I-STAT TROPONIN, ED: TROPONIN I, POC: 0 ng/mL (ref 0.00–0.08)

## 2015-12-19 NOTE — Discharge Instructions (Signed)
You were seen today for worsening numbness and pain in the left arm. You have a known pinched nerve. You're scheduled for surgery. You're concerned that this may be heart related. Your EKG and troponin are reassuring. Follow-up with Dr. Janee Mornhompson as scheduled.
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
## 2015-12-19 NOTE — ED Notes (Signed)
Pt seen traveling in his wheelchair from the department

## 2015-12-19 NOTE — ED Triage Notes (Signed)
C/o numbness and pain to L hand that radiates up L arm x 2 days.  Also reports itching to knuckles.  States he is supposed to have surgery on a pinched nerve in his L elbow that was causing him to have L arm numbness but states the numbness/pain feels different for the last 2 days.

## 2016-05-09 ENCOUNTER — Encounter (HOSPITAL_COMMUNITY): Payer: Self-pay

## 2016-05-09 ENCOUNTER — Ambulatory Visit (HOSPITAL_COMMUNITY)
Admission: EM | Admit: 2016-05-09 | Discharge: 2016-05-09 | Disposition: A | Discharge status: Home / Self Care | Payer: Medicare Other | Source: Home / Self Care

## 2016-05-09 ENCOUNTER — Emergency Department (HOSPITAL_COMMUNITY)
Admission: EM | Admit: 2016-05-09 | Discharge: 2016-05-09 | Disposition: A | Discharge status: Home / Self Care | Payer: Medicare Other | Attending: Emergency Medicine | Admitting: Emergency Medicine

## 2016-05-09 ENCOUNTER — Emergency Department (HOSPITAL_COMMUNITY): Payer: Medicare Other

## 2016-05-09 DIAGNOSIS — Y999 Unspecified external cause status: Secondary | ICD-10-CM | POA: Insufficient documentation

## 2016-05-09 DIAGNOSIS — S82191A Other fracture of upper end of right tibia, initial encounter for closed fracture: Secondary | ICD-10-CM

## 2016-05-09 DIAGNOSIS — S8991XA Unspecified injury of right lower leg, initial encounter: Secondary | ICD-10-CM | POA: Diagnosis present

## 2016-05-09 DIAGNOSIS — Y939 Activity, unspecified: Secondary | ICD-10-CM | POA: Insufficient documentation

## 2016-05-09 DIAGNOSIS — N390 Urinary tract infection, site not specified: Secondary | ICD-10-CM | POA: Diagnosis not present

## 2016-05-09 DIAGNOSIS — I10 Essential (primary) hypertension: Secondary | ICD-10-CM | POA: Diagnosis not present

## 2016-05-09 DIAGNOSIS — Y929 Unspecified place or not applicable: Secondary | ICD-10-CM | POA: Insufficient documentation

## 2016-05-09 LAB — CBC WITH DIFFERENTIAL/PLATELET
BASOS ABS: 0 10*3/uL (ref 0.0–0.1)
BASOS PCT: 0 %
EOS PCT: 3 %
Eosinophils Absolute: 0.3 10*3/uL (ref 0.0–0.7)
HCT: 33.3 % — ABNORMAL LOW (ref 39.0–52.0)
Hemoglobin: 10.5 g/dL — ABNORMAL LOW (ref 13.0–17.0)
Lymphocytes Relative: 19 %
Lymphs Abs: 1.6 10*3/uL (ref 0.7–4.0)
MCH: 28.8 pg (ref 26.0–34.0)
MCHC: 31.5 g/dL (ref 30.0–36.0)
MCV: 91.2 fL (ref 78.0–100.0)
MONO ABS: 0.6 10*3/uL (ref 0.1–1.0)
Monocytes Relative: 7 %
NEUTROS ABS: 6.1 10*3/uL (ref 1.7–7.7)
Neutrophils Relative %: 71 %
PLATELETS: 216 10*3/uL (ref 150–400)
RBC: 3.65 MIL/uL — AB (ref 4.22–5.81)
RDW: 14.7 % (ref 11.5–15.5)
WBC: 8.6 10*3/uL (ref 4.0–10.5)

## 2016-05-09 LAB — URINALYSIS, ROUTINE W REFLEX MICROSCOPIC
Bilirubin Urine: NEGATIVE
GLUCOSE, UA: NEGATIVE mg/dL
HGB URINE DIPSTICK: NEGATIVE
Ketones, ur: NEGATIVE mg/dL
Nitrite: NEGATIVE
PH: 5 (ref 5.0–8.0)
Protein, ur: 30 mg/dL — AB
Specific Gravity, Urine: 1.015 (ref 1.005–1.030)

## 2016-05-09 LAB — COMPREHENSIVE METABOLIC PANEL
ALBUMIN: 3.8 g/dL (ref 3.5–5.0)
ALT: 31 U/L (ref 17–63)
ANION GAP: 10 (ref 5–15)
AST: 35 U/L (ref 15–41)
Alkaline Phosphatase: 61 U/L (ref 38–126)
BILIRUBIN TOTAL: 0.8 mg/dL (ref 0.3–1.2)
BUN: 13 mg/dL (ref 6–20)
CHLORIDE: 101 mmol/L (ref 101–111)
CO2: 28 mmol/L (ref 22–32)
Calcium: 9 mg/dL (ref 8.9–10.3)
Creatinine, Ser: 0.99 mg/dL (ref 0.61–1.24)
GFR calc Af Amer: >60 mL/min (ref >60)
GFR calc non Af Amer: >60 mL/min (ref >60)
GLUCOSE: 102 mg/dL — AB (ref 65–99)
POTASSIUM: 3.8 mmol/L (ref 3.5–5.1)
Sodium: 139 mmol/L (ref 135–145)
TOTAL PROTEIN: 6.8 g/dL (ref 6.5–8.1)

## 2016-05-09 MED ORDER — SODIUM CHLORIDE 0.9 % IV BOLUS (SEPSIS)
1000.0000 mL | Freq: Once | INTRAVENOUS | Status: AC
  Administered 2016-05-09: 1000 mL via INTRAVENOUS

## 2016-05-09 MED ORDER — DEXTROSE 5 % IV SOLN
1.0000 g | Freq: Once | INTRAVENOUS | Status: AC
  Administered 2016-05-09: 1 g via INTRAVENOUS
  Filled 2016-05-09: qty 10

## 2016-05-09 MED ORDER — CEPHALEXIN 500 MG PO CAPS: 500.0000 mg | ORAL_CAPSULE | Freq: Four times a day (QID) | ORAL | 0 refills | Status: DC

## 2016-05-09 NOTE — ED Notes (Signed)
Orthopedic surgeon at bedside. 
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
## 2016-05-09 NOTE — ED Notes (Signed)
Ortho tech contacted for cast padding and ace wraps per Orthopedic

## 2016-05-09 NOTE — Progress Notes (Signed)
Orthopedic Tech Progress Note Patient [name] [birth year] [id number]  Ortho Devices Type of Ortho Device: Lenora Boys splint Ortho Device/Splint Location: Assisted Doctor with application of watson Jones dressing/splint to pt right leg.  Pt tolerated well.  Ortho Device/Splint Interventions: Application   Alvina Chou 05/09/2016, 11:01 PM

## 2016-05-09 NOTE — ED Notes (Signed)
Pt taken to xray 

## 2016-05-09 NOTE — ED Notes (Signed)
Provider at bedside

## 2016-05-09 NOTE — ED Notes (Signed)
Pt in and out cath self.

## 2016-05-09 NOTE — Discharge Instructions (Signed)
Please take antibiotic as prescribed to treat her UTI as well to prevent skin infection related to your leg injury.  Please call Dr. Dietrich Pates office for appointment scheduled for next Tuesday. Keep dressing in place until you follow-up with him in the office  Return without fail for worsening symptoms, including fever, confusion, discoloration of the toes of right foot or any other symptoms concerning to you.
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
## 2016-05-11 LAB — URINE CULTURE: Culture: >=100000 — AB

## 2016-05-12 ENCOUNTER — Telehealth: Payer: Self-pay

## 2016-05-12 NOTE — Telephone Encounter (Signed)
Post ED Visit - Positive Culture Follow-up  Culture report reviewed by antimicrobial stewardship pharmacist:   Enzo Bi, Pharm.D.  Celedonio Miyamoto, [address].D., BCPS AQ-ID  Garvin Fila, Pharm.D., BCPS  Georgina Pillion, Pharm.D., BCPS  [place], [address].D., BCPS, AAHIVP  Estella Husk, Pharm.D., BCPS, AAHIVP  Lysle Pearl, PharmD, BCPS  Casilda Carls, PharmD, BCPS  Pollyann Samples, PharmD, BCPS  Positive urine culture Treated with Cephalexin, organism sensitive to the same and no further patient follow-up is required at this time.  Jerry Caras 05/12/2016, 9:53 AM

## 2018-02-26 IMAGING — DX DG ANKLE COMPLETE 3+V*R*
3 series · 3 of 3 positions shown · non-contrast
Comparison: None.

CLINICAL DATA: Fall yesterday. Right ankle pain and swelling.
Initial encounter.

EXAM:
RIGHT ANKLE - COMPLETE 3+ VIEW

[x ankle ap right]
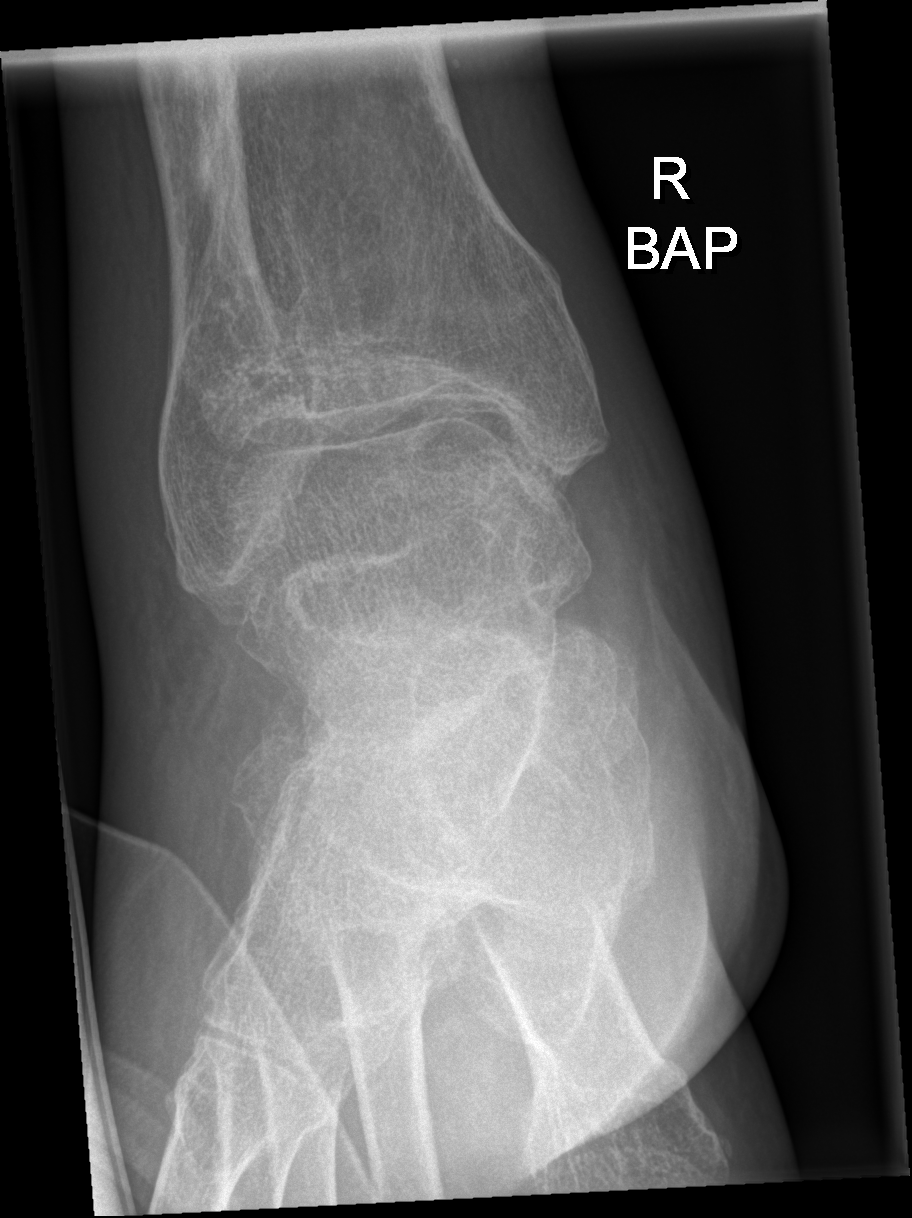

[x ankle obl right]
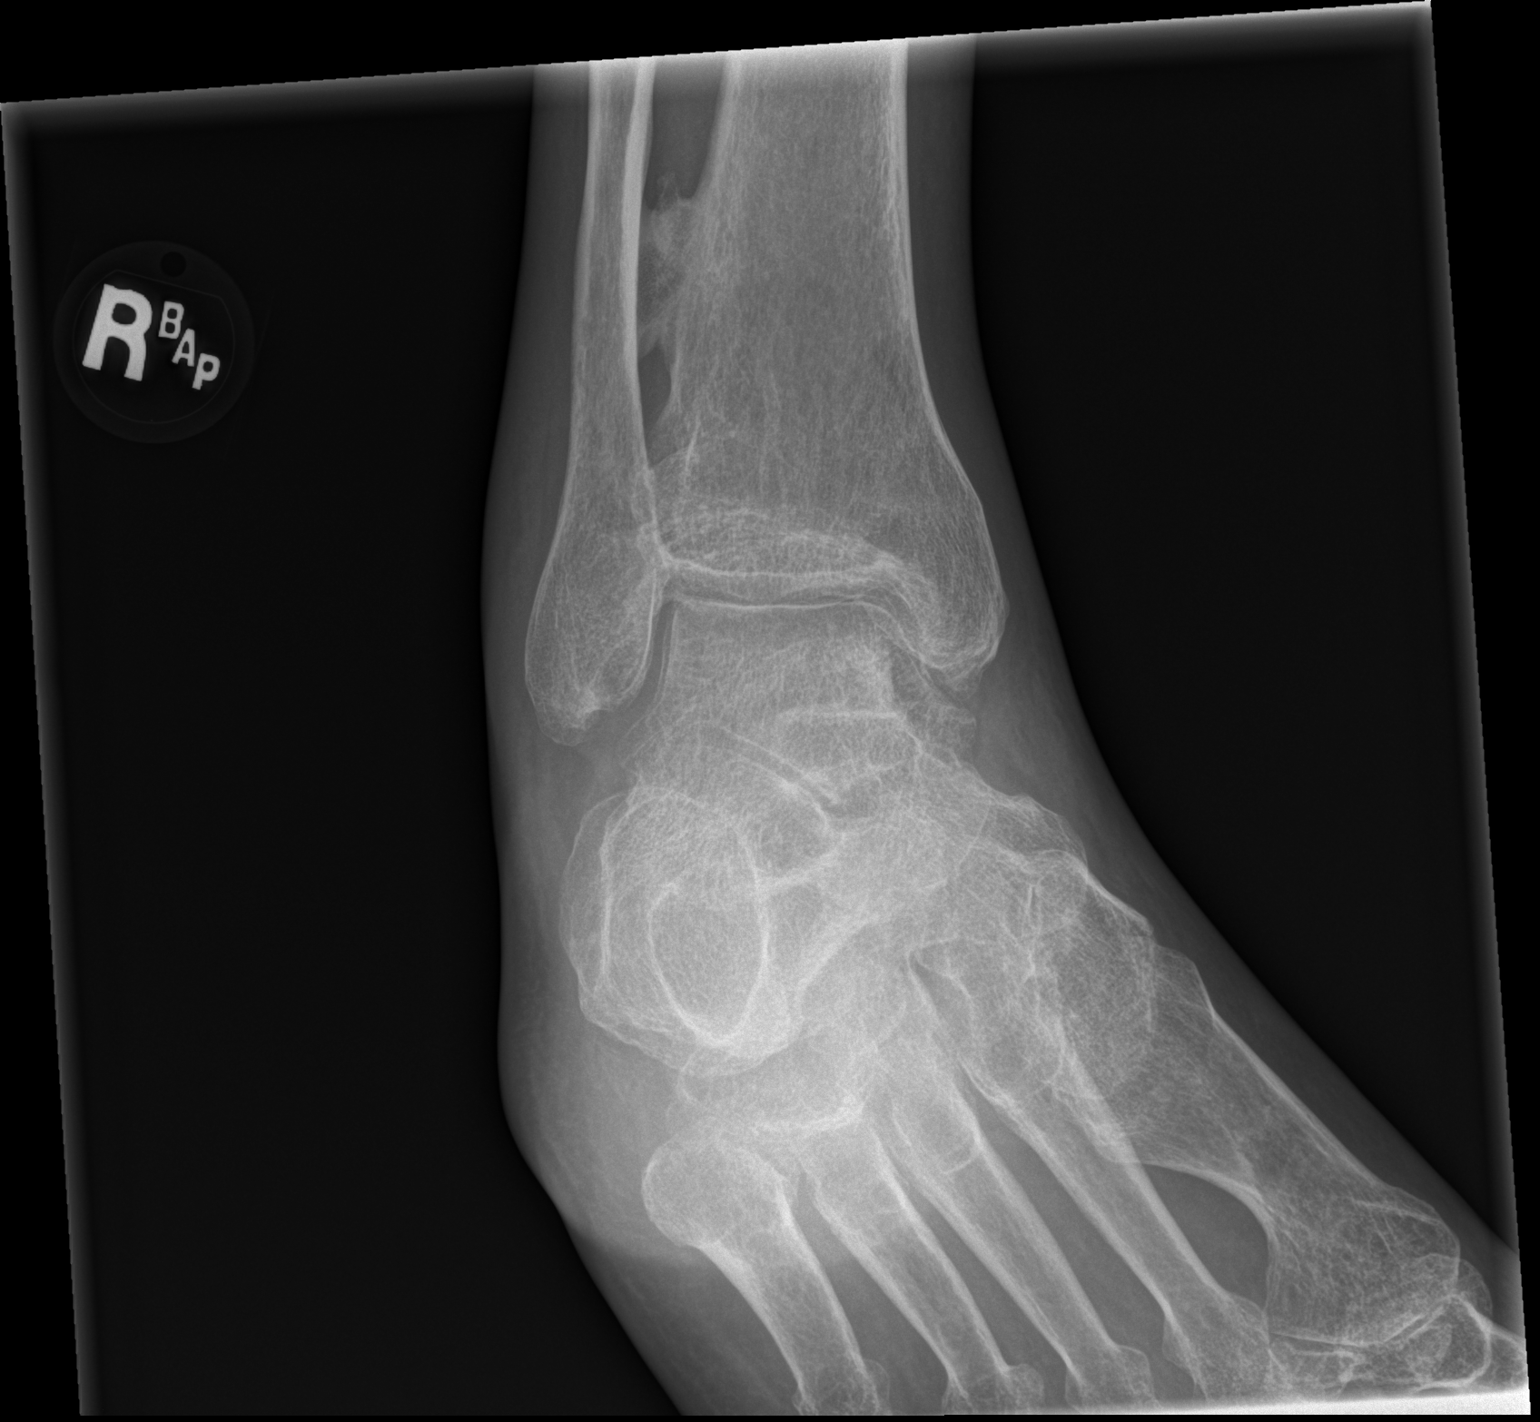

[x ankle lat right]
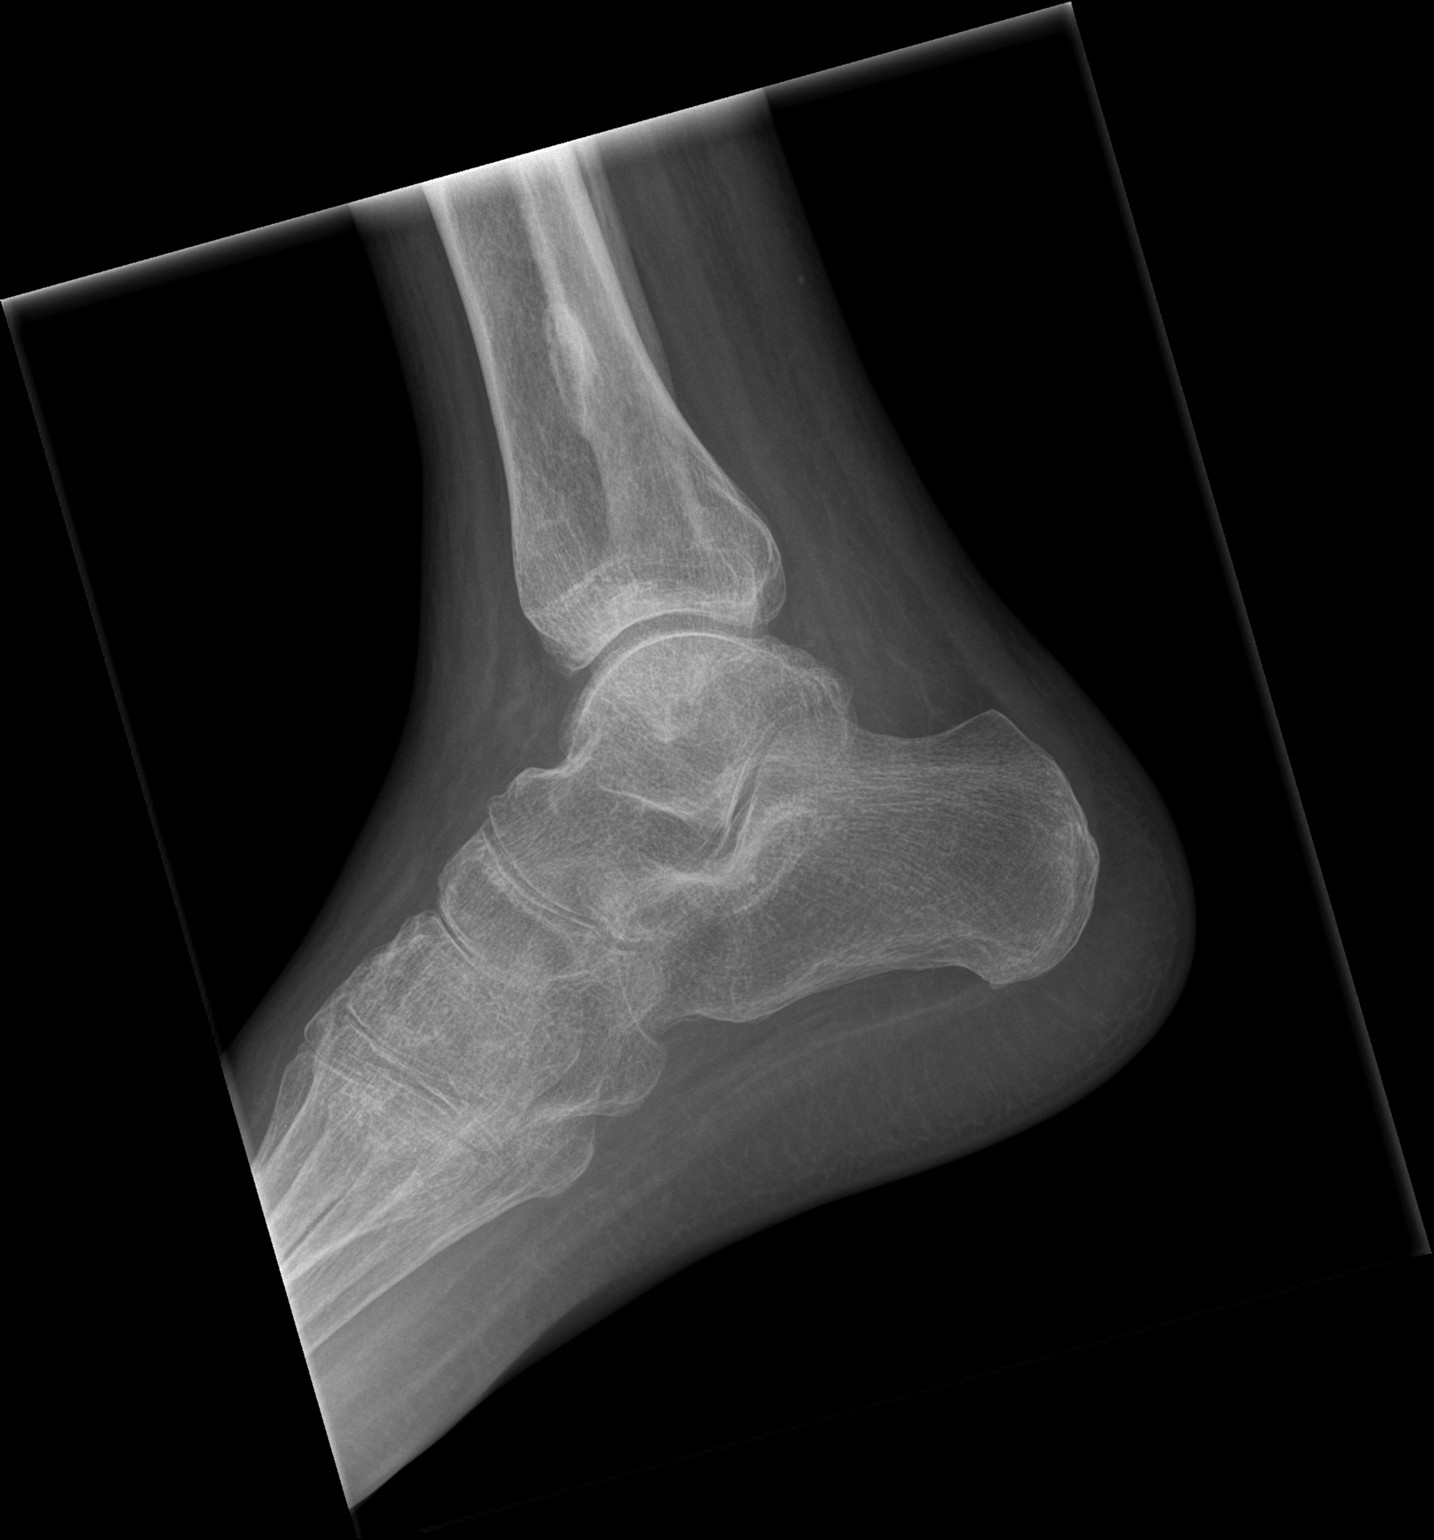

[3 of 3 positions shown; findings below may reference images not displayed]

FINDINGS: There is no evidence of fracture, dislocation, or joint effusion.
Generalized osteopenia noted. Chronic ossification noted along the
distal interosseous ligament.
IMPRESSION: No acute findings.

## 2018-02-26 IMAGING — DX DG KNEE COMPLETE 4+V*R*
4 series · 4 of 4 positions shown · non-contrast
Comparison: None.

CLINICAL DATA: Fall yesterday. Right knee pain and swelling.
Initial encounter.

EXAM:
RIGHT KNEE - COMPLETE 4+ VIEW

[x knee ap right]
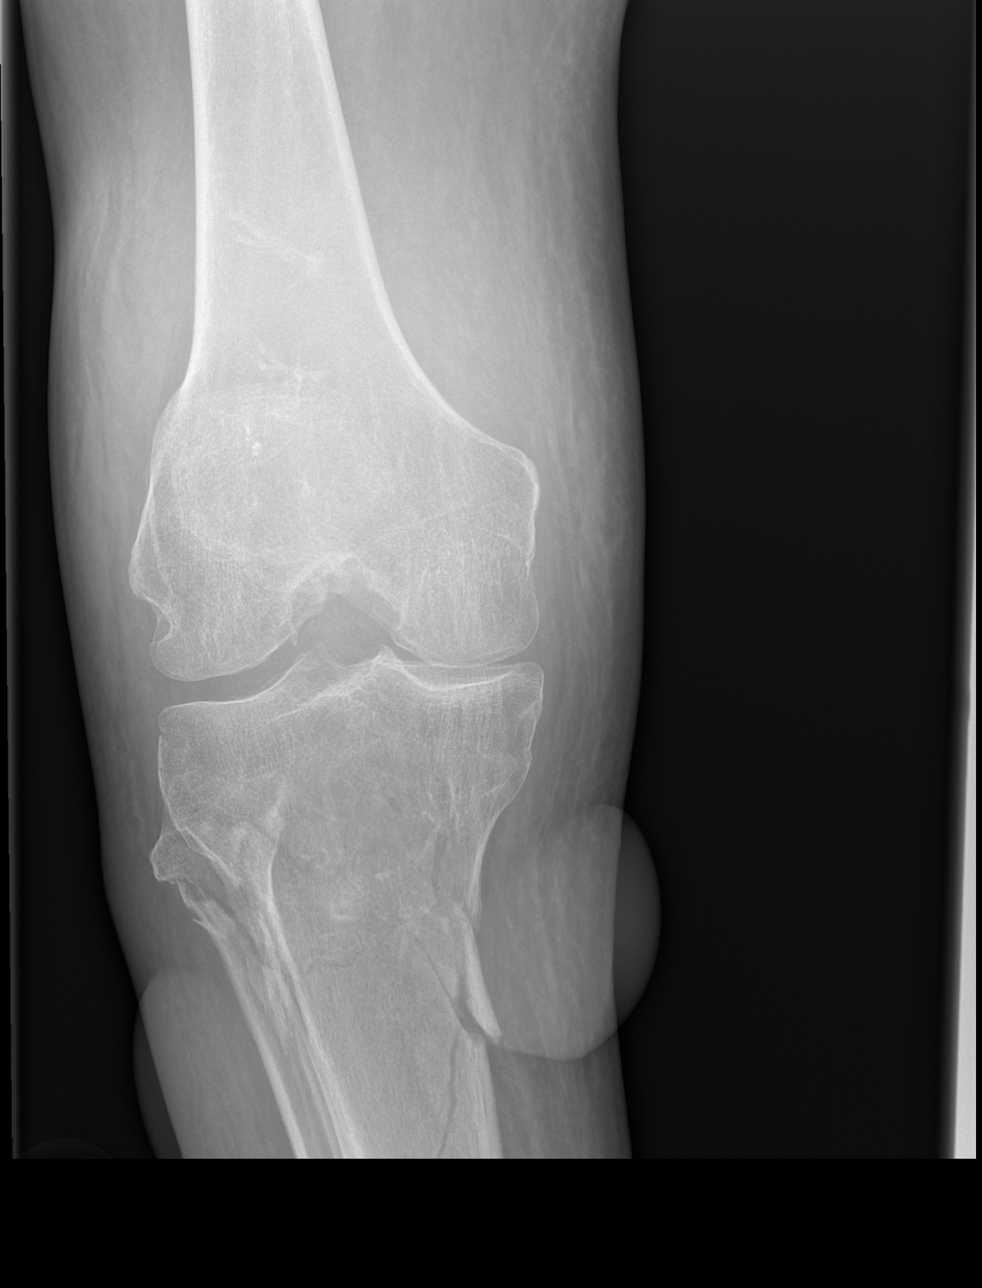

[x knee obl right (1 of 2)]
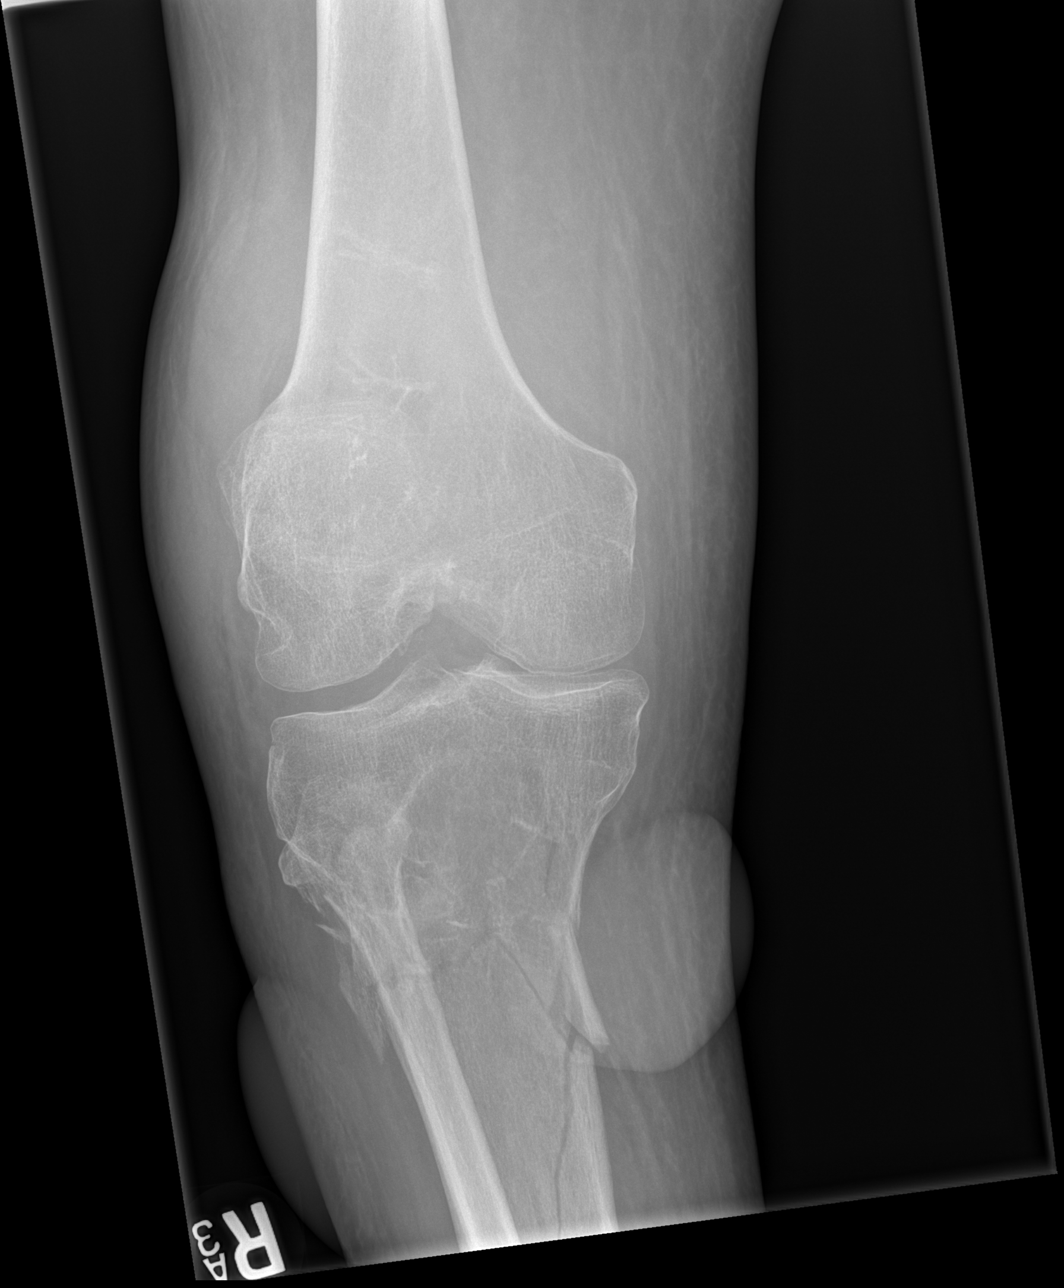

[x knee obl right (2 of 2)]
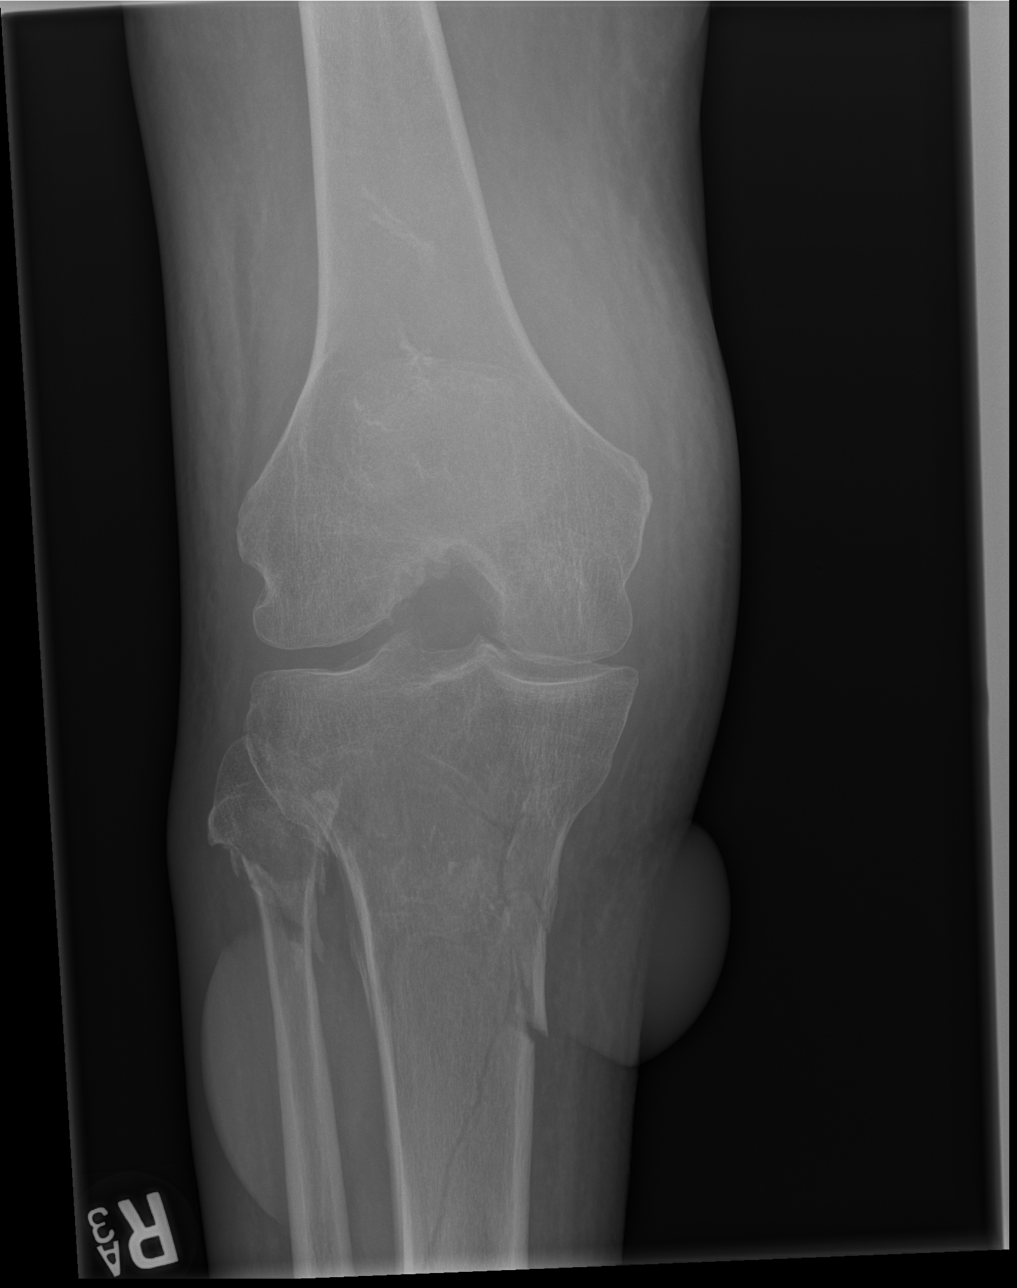

[x knee lat right]
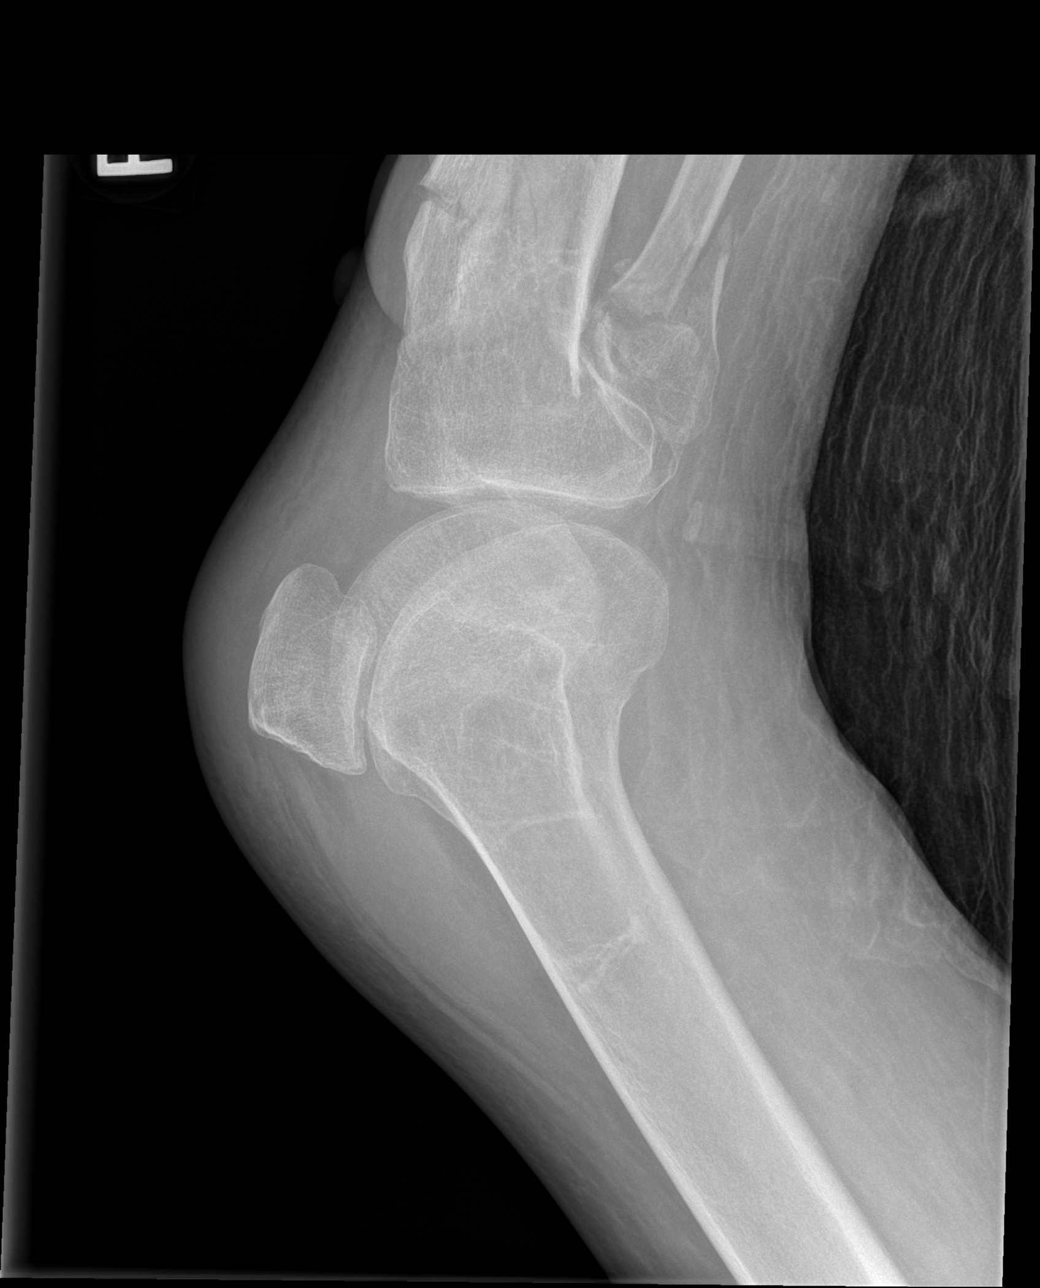

[4 of 4 positions shown; findings below may reference images not displayed]

FINDINGS: Comminuted mildly displaced fractures of the proximal tibial and
fibular metaphyses are seen. Prominent prevertebral soft tissue
swelling seen as well as a small knee joint effusion. Generalized
osteopenia noted.
IMPRESSION: Comminuted fractures of proximal tibia and fibula.

Small knee joint effusion.

Osteopenia.

## 2018-04-11 ENCOUNTER — Emergency Department (HOSPITAL_COMMUNITY): Payer: Medicare Other

## 2018-04-11 ENCOUNTER — Inpatient Hospital Stay (HOSPITAL_COMMUNITY)
Admission: EM | Admit: 2018-04-11 | Discharge: 2018-04-13 | DRG: 917 | Disposition: A | Discharge status: Home / Self Care | Payer: Medicare Other | Attending: Family Medicine | Admitting: Family Medicine

## 2018-04-11 ENCOUNTER — Other Ambulatory Visit: Payer: Self-pay

## 2018-04-11 ENCOUNTER — Encounter (HOSPITAL_COMMUNITY): Payer: Self-pay | Admitting: Emergency Medicine

## 2018-04-11 DIAGNOSIS — S82101S Unspecified fracture of upper end of right tibia, sequela: Secondary | ICD-10-CM

## 2018-04-11 DIAGNOSIS — E876 Hypokalemia: Secondary | ICD-10-CM | POA: Diagnosis present

## 2018-04-11 DIAGNOSIS — Z993 Dependence on wheelchair: Secondary | ICD-10-CM

## 2018-04-11 DIAGNOSIS — G8220 Paraplegia, unspecified: Secondary | ICD-10-CM | POA: Diagnosis present

## 2018-04-11 DIAGNOSIS — Y92009 Unspecified place in unspecified non-institutional (private) residence as the place of occurrence of the external cause: Secondary | ICD-10-CM

## 2018-04-11 DIAGNOSIS — G8929 Other chronic pain: Secondary | ICD-10-CM | POA: Diagnosis present

## 2018-04-11 DIAGNOSIS — R4182 Altered mental status, unspecified: Secondary | ICD-10-CM

## 2018-04-11 DIAGNOSIS — K592 Neurogenic bowel, not elsewhere classified: Secondary | ICD-10-CM | POA: Diagnosis present

## 2018-04-11 DIAGNOSIS — Z9114 Patient's other noncompliance with medication regimen: Secondary | ICD-10-CM

## 2018-04-11 DIAGNOSIS — M1990 Unspecified osteoarthritis, unspecified site: Secondary | ICD-10-CM | POA: Diagnosis present

## 2018-04-11 DIAGNOSIS — Z833 Family history of diabetes mellitus: Secondary | ICD-10-CM | POA: Diagnosis not present

## 2018-04-11 DIAGNOSIS — R4781 Slurred speech: Secondary | ICD-10-CM

## 2018-04-11 DIAGNOSIS — T43011A Poisoning by tricyclic antidepressants, accidental (unintentional), initial encounter: Principal | ICD-10-CM | POA: Diagnosis present

## 2018-04-11 DIAGNOSIS — E785 Hyperlipidemia, unspecified: Secondary | ICD-10-CM | POA: Diagnosis present

## 2018-04-11 DIAGNOSIS — T887XXA Unspecified adverse effect of drug or medicament, initial encounter: Secondary | ICD-10-CM

## 2018-04-11 DIAGNOSIS — R410 Disorientation, unspecified: Secondary | ICD-10-CM

## 2018-04-11 DIAGNOSIS — I10 Essential (primary) hypertension: Secondary | ICD-10-CM | POA: Diagnosis present

## 2018-04-11 DIAGNOSIS — G834 Cauda equina syndrome: Secondary | ICD-10-CM | POA: Diagnosis present

## 2018-04-11 DIAGNOSIS — G92 Toxic encephalopathy: Secondary | ICD-10-CM | POA: Diagnosis present

## 2018-04-11 DIAGNOSIS — N390 Urinary tract infection, site not specified: Secondary | ICD-10-CM | POA: Diagnosis present

## 2018-04-11 DIAGNOSIS — B961 Klebsiella pneumoniae [K. pneumoniae] as the cause of diseases classified elsewhere: Secondary | ICD-10-CM | POA: Diagnosis present

## 2018-04-11 DIAGNOSIS — Z823 Family history of stroke: Secondary | ICD-10-CM | POA: Diagnosis not present

## 2018-04-11 DIAGNOSIS — Z8249 Family history of ischemic heart disease and other diseases of the circulatory system: Secondary | ICD-10-CM | POA: Diagnosis not present

## 2018-04-11 LAB — CREATININE, SERUM
Creatinine, Ser: 0.56 mg/dL — ABNORMAL LOW (ref 0.61–1.24)
GFR calc Af Amer: >60 mL/min (ref >60)
GFR calc non Af Amer: >60 mL/min (ref >60)

## 2018-04-11 LAB — CBC
HCT: 39.6 % (ref 39.0–52.0)
HCT: 42.2 % (ref 39.0–52.0)
Hemoglobin: 12.4 g/dL — ABNORMAL LOW (ref 13.0–17.0)
Hemoglobin: 13.3 g/dL (ref 13.0–17.0)
MCH: 28.2 pg (ref 26.0–34.0)
MCH: 28.6 pg (ref 26.0–34.0)
MCHC: 31.3 g/dL (ref 30.0–36.0)
MCHC: 31.5 g/dL (ref 30.0–36.0)
MCV: 90.2 fL (ref 80.0–100.0)
MCV: 90.8 fL (ref 80.0–100.0)
NRBC: 0 % (ref 0.0–0.2)
Platelets: 223 10*3/uL (ref 150–400)
Platelets: 225 10*3/uL (ref 150–400)
RBC: 4.39 MIL/uL (ref 4.22–5.81)
RBC: 4.65 MIL/uL (ref 4.22–5.81)
RDW: 14.5 % (ref 11.5–15.5)
RDW: 14.7 % (ref 11.5–15.5)
WBC: 7.7 10*3/uL (ref 4.0–10.5)
WBC: 12.2 10*3/uL — ABNORMAL HIGH (ref 4.0–10.5)
nRBC: 0 % (ref 0.0–0.2)

## 2018-04-11 LAB — CBG MONITORING, ED: Glucose-Capillary: 98 mg/dL (ref 70–99)

## 2018-04-11 LAB — URINALYSIS, ROUTINE W REFLEX MICROSCOPIC
Bilirubin Urine: NEGATIVE
Glucose, UA: NEGATIVE mg/dL
Ketones, ur: 20 mg/dL — AB
Nitrite: NEGATIVE
PROTEIN: 30 mg/dL — AB
Specific Gravity, Urine: 1.017 (ref 1.005–1.030)
WBC, UA: >50 WBC/hpf — ABNORMAL HIGH (ref 0–5)
pH: 6 (ref 5.0–8.0)

## 2018-04-11 LAB — ACETAMINOPHEN LEVEL: Acetaminophen (Tylenol), Serum: <10 ug/mL — ABNORMAL LOW (ref 10–30)

## 2018-04-11 LAB — COMPREHENSIVE METABOLIC PANEL
ALBUMIN: 4.1 g/dL (ref 3.5–5.0)
ALT: 38 U/L (ref 0–44)
AST: 85 U/L — ABNORMAL HIGH (ref 15–41)
Alkaline Phosphatase: 137 U/L — ABNORMAL HIGH (ref 38–126)
Anion gap: 10 (ref 5–15)
BUN: 11 mg/dL (ref 6–20)
CHLORIDE: 102 mmol/L (ref 98–111)
CO2: 27 mmol/L (ref 22–32)
Calcium: 9.5 mg/dL (ref 8.9–10.3)
Creatinine, Ser: 0.59 mg/dL — ABNORMAL LOW (ref 0.61–1.24)
GFR calc Af Amer: >60 mL/min (ref >60)
GFR calc non Af Amer: >60 mL/min (ref >60)
Glucose, Bld: 99 mg/dL (ref 70–99)
POTASSIUM: 3.5 mmol/L (ref 3.5–5.1)
Sodium: 139 mmol/L (ref 135–145)
Total Bilirubin: 1.2 mg/dL (ref 0.3–1.2)
Total Protein: 7.8 g/dL (ref 6.5–8.1)

## 2018-04-11 LAB — PROTIME-INR
INR: 1.1 (ref 0.8–1.2)
Prothrombin Time: 14.5 seconds (ref 11.4–15.2)

## 2018-04-11 LAB — DIFFERENTIAL
Abs Immature Granulocytes: 0.02 10*3/uL (ref 0.00–0.07)
Basophils Absolute: 0 10*3/uL (ref 0.0–0.1)
Basophils Relative: 0 %
Eosinophils Absolute: 0 10*3/uL (ref 0.0–0.5)
Eosinophils Relative: 0 %
IMMATURE GRANULOCYTES: 0 %
Lymphocytes Relative: 12 %
Lymphs Abs: 0.9 10*3/uL (ref 0.7–4.0)
Monocytes Absolute: 0.6 10*3/uL (ref 0.1–1.0)
Monocytes Relative: 8 %
Neutro Abs: 6.1 10*3/uL (ref 1.7–7.7)
Neutrophils Relative %: 80 %

## 2018-04-11 LAB — RAPID URINE DRUG SCREEN, HOSP PERFORMED
Amphetamines: NOT DETECTED
Barbiturates: NOT DETECTED
Benzodiazepines: POSITIVE — AB
Cocaine: NOT DETECTED
OPIATES: POSITIVE — AB
Tetrahydrocannabinol: NOT DETECTED

## 2018-04-11 LAB — ETHANOL

## 2018-04-11 LAB — I-STAT CREATININE, ED: Creatinine, Ser: 0.5 mg/dL — ABNORMAL LOW (ref 0.61–1.24)

## 2018-04-11 LAB — APTT: aPTT: 30 seconds (ref 24–36)

## 2018-04-11 LAB — SALICYLATE LEVEL

## 2018-04-11 MED ORDER — SODIUM CHLORIDE 0.9 % IV SOLN
INTRAVENOUS | Status: AC
  Administered 2018-04-11: 20:00:00 via INTRAVENOUS

## 2018-04-11 MED ORDER — ACETAMINOPHEN 325 MG PO TABS
650.0000 mg | ORAL_TABLET | Freq: Four times a day (QID) | ORAL | Status: DC | PRN
  Administered 2018-04-13: 650 mg via ORAL
  Filled 2018-04-11: qty 2

## 2018-04-11 MED ORDER — SODIUM CHLORIDE 0.9 % IV SOLN
1.0000 g | Freq: Once | INTRAVENOUS | Status: AC
  Administered 2018-04-11: 1 g via INTRAVENOUS
  Filled 2018-04-11: qty 10

## 2018-04-11 MED ORDER — NALOXONE HCL 0.4 MG/ML IJ SOLN
INTRAMUSCULAR | Status: AC
  Administered 2018-04-11: 13:00:00
  Filled 2018-04-11: qty 2

## 2018-04-11 MED ORDER — SODIUM CHLORIDE 0.9% FLUSH
3.0000 mL | Freq: Once | INTRAVENOUS | Status: AC
  Administered 2018-04-11: 3 mL via INTRAVENOUS

## 2018-04-11 MED ORDER — ENOXAPARIN SODIUM 40 MG/0.4ML ~~LOC~~ SOLN
40.0000 mg | SUBCUTANEOUS | Status: DC
  Administered 2018-04-11 – 2018-04-12 (×2): 40 mg via SUBCUTANEOUS
  Filled 2018-04-11 (×2): qty 0.4

## 2018-04-11 MED ORDER — NALOXONE HCL 2 MG/2ML IJ SOSY
1.0000 mg | PREFILLED_SYRINGE | Freq: Once | INTRAMUSCULAR | Status: AC
  Administered 2018-04-11: 1 mg via INTRAVENOUS
  Filled 2018-04-11: qty 2

## 2018-04-11 MED ORDER — SODIUM CHLORIDE 0.9 % IV SOLN
1.0000 g | INTRAVENOUS | Status: DC
  Administered 2018-04-12: 1 g via INTRAVENOUS
  Filled 2018-04-11 (×2): qty 10

## 2018-04-11 MED ORDER — ACETAMINOPHEN 650 MG RE SUPP: 650.0000 mg | Freq: Four times a day (QID) | RECTAL | Status: DC | PRN
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
## 2018-04-11 NOTE — ED Notes (Addendum)
Patient repeatedly pulls off EKG leads and gown. Hard to obtain an accurate BP d/t patient constantly moving. Patient repositioned and redirected again. Placed in view from nurse's station.
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
## 2018-04-11 NOTE — ED Notes (Signed)
Patient exhibiting involuntary jerking movements in both arms, more evident in the left - occurs briefly, then goes away for a few minutes. He also appears to be more agitated and confused, more aphasia and he will not keep EKG leads on. Patient redirected. MD aware.

## 2018-04-11 NOTE — ED Notes (Signed)
Patient pulling himself up in bed - when asked to keep his arm still for BP, he stated clearly, "you don't understand how bad this pain is." Patient repositioned in bed for comfort.

## 2018-04-11 NOTE — ED Notes (Signed)
Patient transported to x-ray. ?
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
## 2018-04-11 NOTE — ED Notes (Addendum)
Staff entered room to clean patient up after using bedpan - he is now speaking in clear, full sentences. He is oriented x 2 - person and place, but not to time or situation. He is communicating his needs, but is still having to be redirected and asked question multiple times before he responds. Admitting MD at bedside at this time.

## 2018-04-11 NOTE — ED Notes (Signed)
Patient now talking clearly - multiple, full sentences - he states he lives at home by himself, does everything - clean, cook, move by himself, and he continues to carry on conversation coherently. He remains A&O x 2 however; when asked how he got here, he stated "yeah, I waited out there for a while until you all called my name, then walked back here, and here I am." Patient alert, following commands at this time. NAD noted.

## 2018-04-12 ENCOUNTER — Inpatient Hospital Stay (HOSPITAL_COMMUNITY): Payer: Medicare Other

## 2018-04-12 DIAGNOSIS — G92 Toxic encephalopathy: Secondary | ICD-10-CM | POA: Diagnosis not present

## 2018-04-12 DIAGNOSIS — R4182 Altered mental status, unspecified: Secondary | ICD-10-CM | POA: Diagnosis not present

## 2018-04-12 DIAGNOSIS — N390 Urinary tract infection, site not specified: Secondary | ICD-10-CM | POA: Diagnosis not present

## 2018-04-12 DIAGNOSIS — G8220 Paraplegia, unspecified: Secondary | ICD-10-CM | POA: Diagnosis not present

## 2018-04-12 DIAGNOSIS — T43011A Poisoning by tricyclic antidepressants, accidental (unintentional), initial encounter: Secondary | ICD-10-CM | POA: Diagnosis not present

## 2018-04-12 LAB — BASIC METABOLIC PANEL
Anion gap: 10 (ref 5–15)
BUN: 11 mg/dL (ref 6–20)
CO2: 27 mmol/L (ref 22–32)
Calcium: 9.1 mg/dL (ref 8.9–10.3)
Chloride: 103 mmol/L (ref 98–111)
Creatinine, Ser: 0.52 mg/dL — ABNORMAL LOW (ref 0.61–1.24)
GFR calc Af Amer: >60 mL/min (ref >60)
GFR calc non Af Amer: >60 mL/min (ref >60)
Glucose, Bld: 87 mg/dL (ref 70–99)
Potassium: 2.9 mmol/L — ABNORMAL LOW (ref 3.5–5.1)
SODIUM: 140 mmol/L (ref 135–145)

## 2018-04-12 LAB — CBC
HCT: 39.7 % (ref 39.0–52.0)
Hemoglobin: 12.4 g/dL — ABNORMAL LOW (ref 13.0–17.0)
MCH: 27.9 pg (ref 26.0–34.0)
MCHC: 31.2 g/dL (ref 30.0–36.0)
MCV: 89.2 fL (ref 80.0–100.0)
Platelets: 208 10*3/uL (ref 150–400)
RBC: 4.45 MIL/uL (ref 4.22–5.81)
RDW: 14.6 % (ref 11.5–15.5)
WBC: 7.7 10*3/uL (ref 4.0–10.5)
nRBC: 0 % (ref 0.0–0.2)

## 2018-04-12 LAB — HIV ANTIBODY (ROUTINE TESTING W REFLEX): HIV Screen 4th Generation wRfx: NONREACTIVE

## 2018-04-12 LAB — TSH: TSH: 0.222 u[IU]/mL — AB (ref 0.350–4.500)

## 2018-04-12 MED ORDER — HYDROMORPHONE HCL 2 MG PO TABS
8.0000 mg | ORAL_TABLET | Freq: Three times a day (TID) | ORAL | Status: DC | PRN
  Administered 2018-04-13: 8 mg via ORAL
  Filled 2018-04-12: qty 4

## 2018-04-12 MED ORDER — HYDROMORPHONE HCL 2 MG PO TABS: 8.0000 mg | ORAL_TABLET | Freq: Three times a day (TID) | ORAL | Status: DC

## 2018-04-12 MED ORDER — DIAZEPAM 5 MG PO TABS
10.0000 mg | ORAL_TABLET | Freq: Three times a day (TID) | ORAL | Status: DC
  Administered 2018-04-12 – 2018-04-13 (×4): 10 mg via ORAL
  Filled 2018-04-12 (×4): qty 2

## 2018-04-12 MED ORDER — POTASSIUM CHLORIDE CRYS ER 20 MEQ PO TBCR
40.0000 meq | EXTENDED_RELEASE_TABLET | Freq: Two times a day (BID) | ORAL | Status: DC
  Administered 2018-04-12: 40 meq via ORAL
  Filled 2018-04-12: qty 2

## 2018-04-12 MED ORDER — ATENOLOL 100 MG PO TABS
100.0000 mg | ORAL_TABLET | Freq: Two times a day (BID) | ORAL | Status: DC
  Administered 2018-04-12 – 2018-04-13 (×3): 100 mg via ORAL
  Filled 2018-04-12 (×3): qty 1

## 2018-04-12 NOTE — Progress Notes (Signed)
EEG completed; results pending.    

## 2018-04-12 NOTE — Procedures (Signed)
ELECTROENCEPHALOGRAM REPORT   Patient: [name]       Room #: 9U04V EEG No. ID: [id number] Age: 58 y.o.        Sex: male Referring Physician: Pollie Meyer Report Date:  04/12/2018        Interpreting Physician: Thana Farr  History: [name] is an 58 y.o. male with altered mental status  Medications:  Rocephin, Tenormin  Conditions of Recording:  This is a 21 channel routine scalp EEG performed with bipolar and monopolar montages arranged in accordance to the international 10/20 system of electrode placement. One channel was dedicated to EKG recording.  The patient is in the awake, drowsy and asleep states.  Description:  The waking background activity consists of a low voltage, symmetrical, fairly well organized, 9 Hz alpha activity, seen from the parieto-occipital and posterior temporal regions.  Low voltage fast activity, poorly organized, is seen anteriorly and is at times superimposed on more posterior regions.  A mixture of theta and alpha rhythms are seen from the central and temporal regions. The patient drowses with slowing to irregular, low voltage theta and beta activity.   The patient goes in to a light sleep with symmetrical sleep spindles, vertex central sharp transients and irregular slow activity.  No epileptiform activity is noted.   Hyperventilation and intermittent photic stimulation were not performed.   IMPRESSION: Normal electroencephalogram, awake and asleep. There are no focal lateralizing or epileptiform features.   Thana Farr, MD Neurology [phone] 04/12/2018, 10:14 AM
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
## 2018-04-13 DIAGNOSIS — T43011A Poisoning by tricyclic antidepressants, accidental (unintentional), initial encounter: Secondary | ICD-10-CM | POA: Diagnosis not present

## 2018-04-13 DIAGNOSIS — N390 Urinary tract infection, site not specified: Secondary | ICD-10-CM | POA: Diagnosis not present

## 2018-04-13 LAB — CBC
HCT: 40.7 % (ref 39.0–52.0)
Hemoglobin: 12.7 g/dL — ABNORMAL LOW (ref 13.0–17.0)
MCH: 28 pg (ref 26.0–34.0)
MCHC: 31.2 g/dL (ref 30.0–36.0)
MCV: 89.6 fL (ref 80.0–100.0)
NRBC: 0 % (ref 0.0–0.2)
Platelets: 234 10*3/uL (ref 150–400)
RBC: 4.54 MIL/uL (ref 4.22–5.81)
RDW: 14.8 % (ref 11.5–15.5)
WBC: 6.4 10*3/uL (ref 4.0–10.5)

## 2018-04-13 LAB — URINE CULTURE: Culture: >=100000 — AB

## 2018-04-13 LAB — BASIC METABOLIC PANEL
Anion gap: 9 (ref 5–15)
BUN: 11 mg/dL (ref 6–20)
CO2: 26 mmol/L (ref 22–32)
Calcium: 9 mg/dL (ref 8.9–10.3)
Chloride: 106 mmol/L (ref 98–111)
Creatinine, Ser: 0.59 mg/dL — ABNORMAL LOW (ref 0.61–1.24)
GFR calc Af Amer: >60 mL/min (ref >60)
GFR calc non Af Amer: >60 mL/min (ref >60)
Glucose, Bld: 85 mg/dL (ref 70–99)
Potassium: 3.4 mmol/L — ABNORMAL LOW (ref 3.5–5.1)
Sodium: 141 mmol/L (ref 135–145)

## 2018-04-13 MED ORDER — POTASSIUM CHLORIDE CRYS ER 20 MEQ PO TBCR
40.0000 meq | EXTENDED_RELEASE_TABLET | Freq: Once | ORAL | Status: AC
  Administered 2018-04-13: 40 meq via ORAL
  Filled 2018-04-13: qty 2

## 2018-04-13 MED ORDER — AMLODIPINE BESYLATE 10 MG PO TABS
10.0000 mg | ORAL_TABLET | Freq: Every day | ORAL | Status: DC
  Administered 2018-04-13: 10 mg via ORAL
  Filled 2018-04-13: qty 1

## 2018-04-13 MED ORDER — CEPHALEXIN 250 MG PO CAPS
250.0000 mg | ORAL_CAPSULE | Freq: Three times a day (TID) | ORAL | Status: DC
  Administered 2018-04-13 (×2): 250 mg via ORAL
  Filled 2018-04-13 (×2): qty 1

## 2018-04-13 MED ORDER — CEPHALEXIN 250 MG PO CAPS: 250.0000 mg | ORAL_CAPSULE | Freq: Four times a day (QID) | ORAL | 0 refills | Status: AC

## 2018-04-13 NOTE — Discharge Summary (Signed)
Family Medicine Teaching [facility] Discharge Summary  Patient name: [name] Medical record number: [id number] Date of birth: 1960-02-04 Age: 58 y.o. Gender: male Date of Admission: 04/11/2018  Date of Discharge: 04/13/2018 Admitting Physician: Latrelle Dodrill, MD  Primary Care Provider: Leilani Able, MD Consultants: None  Indication for Hospitalization: Altered mental status  Discharge Diagnoses/Problem List:  Accidental overdose Paraplegia Urinary tract infection Chronic pain Hypertension Hypokalemia  Disposition: Home  Discharge Condition: Improved, stable  Discharge Exam:  BP (!) 154/93 (BP Location: Left Arm)   Pulse (!) 53   Temp 97.8 F (36.6 C) (Oral)   Resp (!) 28   SpO2 98%  General:Lying in bed w/ blanket pulled over face. Cardiovascular: RRR no MRG  Respiratory: NWOB, CTAB Gastrointestinal:soft, nontender.  PVX:YIAXK extremity paraplegic, legs  atrophy of the lower extremities  Skin: warm, dry  Psych:flat affect, A&Ox3  Brief Hospital Course:  Patient was brought to the ED after being found on the ground by a friend earlier that morning, while being altered and confused.  In the ED the patient was given Narcan x2, with temporary improvement in mental status.  Patient was initially's more somnolent, but became more alert while in the ED but remained very confused.  Patient had been on for medications that could have possibly caused alteration including: Lyrica, Dilaudid, Elavil, Valium.  EEG, TSH, EKG, head CT, Tylenol, ASA were all normal.  Patient was admitted to the floor and observed overnight.  Poison control was called and advised not to use Narcan drip.  The next morning the patient improved mentally and was alert and oriented x3.  Patient was found to have a UTI, positive for Klebsiella.  He was initially on ceftriaxone but switch to Keflex and sent home to finish out course of antibiotics.  Patient was discharged the day after arrival and  improved, stable condition.  Issues for Follow Up:  1. Medication management- patient was on recently increased dose of Elavil, unsure if he took too much or this is reaction to the amount prescribed.  Discontinued on discharge.  Reevaluate on follow-up with PCP.  Also reevaluate dosages of other potentially altering medications. 2. UTI-patient self catheterizes at home.  Possible cause of Klebsiella UTI.  Sensitive patient has taken full course of antibiotics, if he is having any symptoms of UTI. 3. Ronica leg pain-patient never fully recovered/healed from right tibial fracture approximately 1 year ago.  Still having consistent pain.  Consider Ortho consult.  Significant Procedures: None  Significant Labs and Imaging:  Recent Labs  Lab 04/11/18 2031 04/12/18 0438 04/13/18 0445  WBC 12.2* 7.7 6.4  HGB 13.3 12.4* 12.7*  HCT 42.2 39.7 40.7  PLT 225 208 234   Recent Labs  Lab 04/11/18 1231 04/11/18 1240 04/11/18 2031 04/12/18 0438 04/13/18 0445  NA 139  --   --  140 141  K 3.5  --   --  2.9* 3.4*  CL 102  --   --  103 106  CO2 27  --   --  27 26  GLUCOSE 99  --   --  87 85  BUN 11  --   --  11 11  CREATININE 0.59* 0.50* 0.56* 0.52* 0.59*  CALCIUM 9.5  --   --  9.1 9.0  ALKPHOS 137*  --   --   --   --   AST 85*  --   --   --   --   ALT 38  --   --   --   --  ALBUMIN 4.1  --   --   --   --       Results/Tests Pending at Time of Discharge: Blood culture,  Discharge Medications:  Allergies as of 04/13/2018      Reactions   Cymbalta [duloxetine Hcl] Nausea Only, Other (See Comments)   Nightmares      Medication List    STOP taking these medications   amitriptyline 150 MG tablet Commonly known as:  ELAVIL     TAKE these medications   atenolol 50 MG tablet Commonly known as:  TENORMIN Take 100 mg by mouth 2 (two) times daily.   cephALEXin 250 MG capsule Commonly known as:  KEFLEX Take 1 capsule (250 mg total) by mouth 4 (four) times daily for 12 days.    diazepam 10 MG tablet Commonly known as:  VALIUM Take 10 mg by mouth 3 (three) times daily.   HYDROmorphone 8 MG tablet Commonly known as:  DILAUDID Take 8 mg by mouth 3 (three) times daily.   multivitamin with minerals Tabs tablet Take 1 tablet by mouth daily.   pregabalin 300 MG capsule Commonly known as:  LYRICA Take 300 mg by mouth 2 (two) times daily.       Discharge Instructions: Please refer to Patient Instructions section of EMR for full details.  Patient was counseled important signs and symptoms that should prompt return to medical care, changes in medications, dietary instructions, activity restrictions, and follow up appointments.   Follow-Up Appointments: Follow-up Information    Leilani Able, MD. Schedule an appointment as soon as possible for a visit in 1 week(s).   Specialty:  Family Medicine Contact information: [address] [place] [address] [phone]           Sandre Kitty, MD 04/16/2018, 5:27 PM PGY-1, [facility]

## 2018-04-13 NOTE — Discharge Instructions (Signed)
You likely had an accidental ingestion of too much amitriptyline.  This is why you were altered.  We have discontinued your amitriptyline at discharge.  Please do not take it until you have talked with your primary care provider.  We have also written a prescription for an antibiotics called Keflex that you will take for 12 more days.  Please take them as instructed on your prescription.  Accidental Drug Poisoning, Adult Accidental drug poisoning happens when a person accidentally takes too much of a substance, such as a prescription medicine, an over-the-counter medicine, a vitamin, a supplement, or an illegal drug. The effects of drug poisoning can be mild, dangerous, or even deadly. What are the causes? This condition is caused by taking too much of a medicine, illegal drug, or other substance. It often results from:  Lack of knowledge about a substance.  Using more than one substance at the same time.  An error made by the health care provider who prescribed the substance.  An error made by the pharmacist who filled the prescription.  A lapse in memory, such as forgetting that you have already taken a dose of the medicine.  Suddenly using a substance after a long period of not using it. The following substances and medicines are more likely to cause an accidental drug poisoning:  Medicines that treat mental problems (psychotropic medicines).  Pain medicines.  Cocaine.  Heroin.  Multivitamins that contain iron.  Over-the-counter cold and cough medicines. What increases the risk? This condition is more likely to occur in:  Elderly adults. Elderly adults are at risk because they may: ? Be taking many different medicines. ? Have difficulty reading labels. ? Forget when they last took their medicine.  People who use illegal drugs.  People who drink alcohol while using illegal drugs or certain medicines.  People with certain mental health conditions. What are the signs or  symptoms? Symptoms of this condition depend on the substance and the amount that was taken. Common symptoms include:  Behavior changes, such as confusion.  Sleepiness.  Weakness.  Slowed breathing.  Nausea and vomiting.  Seizures.  Very large or small eye pupil size. A drug poisoning can cause a very serious condition in which your blood pressure drops to a low level (shock). Symptoms of shock include:  Cold and clammy skin.  Pale skin.  Blue lips.  Very slow breathing.  Extreme sleepiness.  Severe confusion.  Dizziness or fainting. How is this diagnosed? This condition is diagnosed based on:  Your symptoms. You will be asked about the substances you took and when you took them.  A physical exam. You may also have other tests, including:  Urine tests.  Blood tests.  An electrocardiogram (ECG). How is this treated? This condition may need to be treated right away at the hospital. Treatment may involve:  Getting fluids and electrolytes through an IV.  Having a breathing tube inserted in your airway (endotracheal tube) to help you breathe.  Taking medicines. These may include medicines that: ? Absorb any substance that is in your digestive system. ? Block or reverse the effect of the substance that caused the drug poisoning.  Having your blood filtered through an artificial kidney machine (hemodialysis).  Ongoing counseling and mental health support. This may be provided if you used an illegal drug. Follow these instructions at home: Medicines   Take over-the-counter and prescription medicines only as told by your health care provider.  Before taking a new medicine, ask your health care  provider whether the medicine: ? May cause side effects. ? Might react with other medicines.  Keep a list of all the medicines that you take, including over-the-counter medicines, vitamins, supplements, and herbs. Bring this list with you to all of your medical  visits. General instructions   Drink enough fluid to keep your urine pale yellow.  If you are working with a counselor or mental health professional, make sure to follow his or her instructions.  Do not drink alcohol if: ? Your health care provider tells you not to drink. ? You are pregnant, may be pregnant, or are planning to become pregnant.  If you drink alcohol, limit how much you have: ? 0-1 drink a day for women. ? 0-2 drinks a day for men.  Be aware of how much alcohol is in your drink. In the [place] one drink equals one typical bottle of beer (12 oz), one-half glass of wine (5 oz), or one shot of hard liquor (1 oz).  Keep all follow-up visits as told by your health care provider. This is important. How is this prevented?   Get help if you are struggling with: ? Alcohol or drug use. ? Depression or another mental health problem.  Keep the phone number of your local poison control center near your phone or on your cell phone. The hotline of the [employer] is (800631-048-3009.  Store all medicines in safety containers that are out of the reach of children.  Read the drug inserts that come with your medicines.  Create a system for taking your medicine, such as a pillbox, that will help you avoid taking too much of the medicine.  Do not drink alcohol while taking medicines unless your health care provider approves.  Do not use illegal drugs.  Do not take medicines that are not prescribed for you. Contact a health care provider if:  Your symptoms return.  You develop new symptoms or side effects after taking a medicine.  You have questions about possible drug poisoning. Call your local poison control center at (787) 511-0898. Get help right away if:  You think that you or someone else may have taken too much of a substance.  You or someone else is having symptoms of drug poisoning. Summary  Accidental drug poisoning happens when  a person accidentally takes too much of a substance, such as a prescription medicine, an over-the-counter medicine, a vitamin, a supplement, or an illegal drug.  The effects of drug poisoning can be mild, dangerous, or even deadly.  This condition is diagnosed based on your symptoms and a physical exam. You will be asked to tell your health care provider which substances you took and when you took them.  This condition may need to be treated right away at the hospital. This information is not intended to replace advice given to you by your health care provider. Make sure you discuss any questions you have with your health care provider. Document Released: 03/31/2004 Document Revised: 12/17/2016 Document Reviewed: 12/17/2016 Elsevier Interactive Patient Education  2019 [employer].

## 2018-04-13 NOTE — Progress Notes (Signed)
Nurse went over discharge with patient. Patient verbalized understanding of discharge. Discharging home Via [facility]. He dose not any belong here with him at the hospital All questions and concern addressed.
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
## 2018-04-13 NOTE — Progress Notes (Signed)
Patient will DC to: Home Anticipated DC date: 04/13/2018 Family notified: Yes Transport by: Sharin Mons   CSW will sign off for now as social work intervention is no longer needed. Please consult [place] again if new needs arise.  Klayton Monie, LCSW-A [place]/Clinical Social Work Department Cell: [phone]

## 2018-04-16 LAB — CULTURE, BLOOD (ROUTINE X 2)
Culture: NO GROWTH
Culture: NO GROWTH

## 2019-08-17 ENCOUNTER — Encounter (HOSPITAL_COMMUNITY): Payer: Self-pay

## 2019-08-17 ENCOUNTER — Emergency Department (HOSPITAL_COMMUNITY): Payer: Medicare HMO

## 2019-08-17 ENCOUNTER — Emergency Department (HOSPITAL_COMMUNITY)
Admission: EM | Admit: 2019-08-17 | Discharge: 2019-08-18 | Disposition: A | Discharge status: Home / Self Care | Payer: Medicare HMO | Attending: Emergency Medicine | Admitting: Emergency Medicine

## 2019-08-17 DIAGNOSIS — I959 Hypotension, unspecified: Secondary | ICD-10-CM | POA: Diagnosis not present

## 2019-08-17 DIAGNOSIS — Z5321 Procedure and treatment not carried out due to patient leaving prior to being seen by health care provider: Secondary | ICD-10-CM | POA: Diagnosis not present

## 2019-08-17 DIAGNOSIS — R0602 Shortness of breath: Secondary | ICD-10-CM | POA: Diagnosis not present

## 2019-08-17 DIAGNOSIS — R42 Dizziness and giddiness: Secondary | ICD-10-CM | POA: Insufficient documentation

## 2019-08-17 LAB — BASIC METABOLIC PANEL WITH GFR
Anion gap: 10 (ref 5–15)
BUN: 12 mg/dL (ref 6–20)
CO2: 28 mmol/L (ref 22–32)
Calcium: 8.9 mg/dL (ref 8.9–10.3)
Chloride: 100 mmol/L (ref 98–111)
Creatinine, Ser: 0.53 mg/dL — ABNORMAL LOW (ref 0.61–1.24)
GFR calc Af Amer: >60 mL/min
GFR calc non Af Amer: >60 mL/min
Glucose, Bld: 97 mg/dL (ref 70–99)
Potassium: 3.5 mmol/L (ref 3.5–5.1)
Sodium: 138 mmol/L (ref 135–145)

## 2019-08-17 LAB — CBC
HCT: 38.9 % — ABNORMAL LOW (ref 39.0–52.0)
Hemoglobin: 12.5 g/dL — ABNORMAL LOW (ref 13.0–17.0)
MCH: 30 pg (ref 26.0–34.0)
MCHC: 32.1 g/dL (ref 30.0–36.0)
MCV: 93.5 fL (ref 80.0–100.0)
Platelets: 230 K/uL (ref 150–400)
RBC: 4.16 MIL/uL — ABNORMAL LOW (ref 4.22–5.81)
RDW: 13.2 % (ref 11.5–15.5)
WBC: 6.1 K/uL (ref 4.0–10.5)
nRBC: 0 % (ref 0.0–0.2)

## 2019-08-17 MED ORDER — SODIUM CHLORIDE 0.9% FLUSH: 3.0000 mL | Freq: Once | INTRAVENOUS | Status: DC

## 2019-08-17 NOTE — ED Triage Notes (Signed)
Pt arriving from home with complaints of recent low blood pressure readings at home, shortness of breath, and dizziness that has resolved. States he feels fine now other than a bit of lightheadedness.

## 2019-08-17 NOTE — ED Triage Notes (Signed)
Pt was feeling dizzy last night and today, took blood pressure and it read low.

## 2019-08-17 NOTE — ED Notes (Signed)
Pt called 3 times no response  

## 2020-06-16 ENCOUNTER — Encounter: Payer: Self-pay | Admitting: Physical Medicine and Rehabilitation

## 2020-07-19 ENCOUNTER — Other Ambulatory Visit: Payer: Self-pay

## 2020-07-19 ENCOUNTER — Ambulatory Visit: Payer: Medicare HMO | Attending: Family Medicine | Admitting: Physical Therapy

## 2020-07-19 DIAGNOSIS — R29818 Other symptoms and signs involving the nervous system: Secondary | ICD-10-CM | POA: Diagnosis present

## 2020-07-19 DIAGNOSIS — M6281 Muscle weakness (generalized): Secondary | ICD-10-CM | POA: Insufficient documentation
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
## 2020-08-08 ENCOUNTER — Encounter: Payer: Medicare HMO | Attending: Physical Medicine and Rehabilitation | Admitting: Physical Medicine and Rehabilitation

## 2020-08-08 ENCOUNTER — Encounter: Payer: Self-pay | Admitting: Physical Medicine and Rehabilitation

## 2020-08-08 ENCOUNTER — Other Ambulatory Visit: Payer: Self-pay

## 2020-08-08 VITALS — BP 139/94 | HR 88 | Temp 98.5°F | Ht 71.0 in | Wt 150.0 lb

## 2020-08-08 DIAGNOSIS — K592 Neurogenic bowel, not elsewhere classified: Secondary | ICD-10-CM | POA: Diagnosis present

## 2020-08-08 DIAGNOSIS — N319 Neuromuscular dysfunction of bladder, unspecified: Secondary | ICD-10-CM | POA: Insufficient documentation

## 2020-08-08 DIAGNOSIS — K649 Unspecified hemorrhoids: Secondary | ICD-10-CM | POA: Insufficient documentation

## 2020-08-08 DIAGNOSIS — G8220 Paraplegia, unspecified: Secondary | ICD-10-CM | POA: Insufficient documentation

## 2020-08-08 MED ORDER — GABAPENTIN 300 MG PO CAPS
300.0000 mg | ORAL_CAPSULE | Freq: Every day | ORAL | 5 refills | Status: DC
Start: 2020-08-08 — End: 2020-08-30
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
## 2020-08-30 ENCOUNTER — Other Ambulatory Visit: Payer: Self-pay | Admitting: *Deleted

## 2020-08-30 MED ORDER — GABAPENTIN 300 MG PO CAPS: 300.0000 mg | ORAL_CAPSULE | Freq: Every day | ORAL | 5 refills | Status: DC

## 2020-09-13 ENCOUNTER — Encounter: Payer: Self-pay | Admitting: *Deleted

## 2020-09-13 NOTE — Telephone Encounter (Signed)
Opened in error

## 2020-09-21 ENCOUNTER — Emergency Department (HOSPITAL_COMMUNITY): Payer: Medicare HMO

## 2020-09-21 ENCOUNTER — Emergency Department (HOSPITAL_COMMUNITY)
Admission: EM | Admit: 2020-09-21 | Discharge: 2020-09-21 | Disposition: A | Discharge status: Home / Self Care | Payer: Medicare HMO | Attending: Emergency Medicine | Admitting: Emergency Medicine

## 2020-09-21 ENCOUNTER — Other Ambulatory Visit: Payer: Self-pay

## 2020-09-21 DIAGNOSIS — Z8669 Personal history of other diseases of the nervous system and sense organs: Secondary | ICD-10-CM

## 2020-09-21 DIAGNOSIS — Z79899 Other long term (current) drug therapy: Secondary | ICD-10-CM | POA: Diagnosis not present

## 2020-09-21 DIAGNOSIS — K5900 Constipation, unspecified: Secondary | ICD-10-CM | POA: Diagnosis not present

## 2020-09-21 DIAGNOSIS — G8220 Paraplegia, unspecified: Secondary | ICD-10-CM | POA: Diagnosis not present

## 2020-09-21 DIAGNOSIS — M545 Low back pain, unspecified: Secondary | ICD-10-CM | POA: Diagnosis present

## 2020-09-21 DIAGNOSIS — K644 Residual hemorrhoidal skin tags: Secondary | ICD-10-CM | POA: Diagnosis not present

## 2020-09-21 DIAGNOSIS — M62830 Muscle spasm of back: Secondary | ICD-10-CM

## 2020-09-21 DIAGNOSIS — I10 Essential (primary) hypertension: Secondary | ICD-10-CM | POA: Insufficient documentation

## 2020-09-21 LAB — CBC WITH DIFFERENTIAL/PLATELET
Abs Immature Granulocytes: 0.02 10*3/uL (ref 0.00–0.07)
Basophils Absolute: 0 10*3/uL (ref 0.0–0.1)
Basophils Relative: 0 %
Eosinophils Absolute: 0.2 10*3/uL (ref 0.0–0.5)
Eosinophils Relative: 3 %
HCT: 42.6 % (ref 39.0–52.0)
Hemoglobin: 14 g/dL (ref 13.0–17.0)
Immature Granulocytes: 0 %
Lymphocytes Relative: 31 %
Lymphs Abs: 2.2 10*3/uL (ref 0.7–4.0)
MCH: 31.3 pg (ref 26.0–34.0)
MCHC: 32.9 g/dL (ref 30.0–36.0)
MCV: 95.3 fL (ref 80.0–100.0)
Monocytes Absolute: 0.7 10*3/uL (ref 0.1–1.0)
Monocytes Relative: 10 %
Neutro Abs: 4 10*3/uL (ref 1.7–7.7)
Neutrophils Relative %: 56 %
Platelets: 328 10*3/uL (ref 150–400)
RBC: 4.47 MIL/uL (ref 4.22–5.81)
RDW: 13.1 % (ref 11.5–15.5)
WBC: 7.1 10*3/uL (ref 4.0–10.5)
nRBC: 0 % (ref 0.0–0.2)

## 2020-09-21 LAB — URINALYSIS, ROUTINE W REFLEX MICROSCOPIC
Bilirubin Urine: NEGATIVE
Glucose, UA: NEGATIVE mg/dL
Hgb urine dipstick: NEGATIVE
Ketones, ur: 5 mg/dL — AB
Leukocytes,Ua: NEGATIVE
Nitrite: NEGATIVE
Protein, ur: NEGATIVE mg/dL
Specific Gravity, Urine: 1.02 (ref 1.005–1.030)
pH: 6 (ref 5.0–8.0)

## 2020-09-21 LAB — COMPREHENSIVE METABOLIC PANEL
ALT: 17 U/L (ref 0–44)
AST: 22 U/L (ref 15–41)
Albumin: 4 g/dL (ref 3.5–5.0)
Alkaline Phosphatase: 74 U/L (ref 38–126)
Anion gap: 6 (ref 5–15)
BUN: 11 mg/dL (ref 6–20)
CO2: 32 mmol/L (ref 22–32)
Calcium: 9.6 mg/dL (ref 8.9–10.3)
Chloride: 100 mmol/L (ref 98–111)
Creatinine, Ser: 0.56 mg/dL — ABNORMAL LOW (ref 0.61–1.24)
GFR, Estimated: >60 mL/min (ref >60)
Glucose, Bld: 99 mg/dL (ref 70–99)
Potassium: 4.2 mmol/L (ref 3.5–5.1)
Sodium: 138 mmol/L (ref 135–145)
Total Bilirubin: 0.9 mg/dL (ref 0.3–1.2)
Total Protein: 7.4 g/dL (ref 6.5–8.1)

## 2020-09-21 LAB — POC OCCULT BLOOD, ED: Fecal Occult Bld: NEGATIVE

## 2020-09-21 NOTE — ED Triage Notes (Signed)
Pt c/o lower back pain since this morning.

## 2020-09-21 NOTE — ED Notes (Signed)
Help get patient into a gown on the monitor patient is resting with call bell in reach 

## 2020-09-21 NOTE — ED Notes (Signed)
Pt transported back to room from CT via stretcher at this time.  
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
## 2020-09-21 NOTE — ED Notes (Signed)
Pt seen leaving department via wheelchair prior to discharge instructions being discussed.
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
## 2020-09-21 NOTE — ED Notes (Signed)
Pt transported to CT via stretcher at this time.  

## 2020-10-10 ENCOUNTER — Encounter: Payer: Medicare HMO | Admitting: Physical Medicine and Rehabilitation

## 2020-10-10 ENCOUNTER — Telehealth: Payer: Self-pay | Admitting: Physical Medicine and Rehabilitation

## 2020-10-10 NOTE — Telephone Encounter (Signed)
Patient is taking Gabapentin 300 mg 4 tablets in the am, 4 tablets in the pm. He states he is willing to up the mg to decrease the tablets. He says it is helping a great deal with his pain but it is making him constipated.

## 2020-10-10 NOTE — Telephone Encounter (Signed)
In need of change of rx gabapentin to 4 tablets 2x per day total of 8 up the mg 2x per he was unable to make his appt today due to transportation. He has been rescheduled to 10/14 for his next apt

## 2020-11-11 ENCOUNTER — Other Ambulatory Visit: Payer: Self-pay

## 2020-11-11 ENCOUNTER — Encounter: Payer: Self-pay | Admitting: Physical Medicine and Rehabilitation

## 2020-11-11 ENCOUNTER — Encounter: Payer: Medicare HMO | Attending: Physical Medicine and Rehabilitation | Admitting: Physical Medicine and Rehabilitation

## 2020-11-11 VITALS — BP 113/70 | HR 72 | Ht 70.0 in | Wt 161.0 lb

## 2020-11-11 DIAGNOSIS — Z993 Dependence on wheelchair: Secondary | ICD-10-CM | POA: Insufficient documentation

## 2020-11-11 DIAGNOSIS — M7918 Myalgia, other site: Secondary | ICD-10-CM | POA: Diagnosis not present

## 2020-11-11 DIAGNOSIS — M542 Cervicalgia: Secondary | ICD-10-CM | POA: Diagnosis not present

## 2020-11-11 DIAGNOSIS — G8220 Paraplegia, unspecified: Secondary | ICD-10-CM | POA: Diagnosis not present

## 2020-11-11 MED ORDER — GABAPENTIN 600 MG PO TABS: 1200.0000 mg | ORAL_TABLET | Freq: Three times a day (TID) | ORAL | 5 refills | Status: DC
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
## 2020-11-11 NOTE — Patient Instructions (Signed)
Pt is a 59 yr R handed male with hx of T12/L1 paraplegia- at age 60- 46-  Neurogenic bowel and bladder- no spasticity; colostomy for 3 years- 1991-1994- reversed- likely has adhesions- could also have additional referred pain from severe B/L hip DJD-   And chronic pain due to eating/BMs- Here for f/u on SCI and chronic pain.    See a massage therapist or Dr Antoine Primas- DO- will place Referral for Dr Katrinka Blazing and see if he can see him.    2. Will increase Gabapentin to 1200 mg 3x/day- for nerve pain- 600 mg tabs- with 5 refills.    3. New manual w/c is coming before November- adapt- and getting new cushion.    4. Call GI back about hemorrhoids- needs to at least be evaluated.   5. Skin doing well- con't daily checking.    6. F/U in 3 months- double visit. SCI

## 2020-11-28 ENCOUNTER — Ambulatory Visit: Payer: Medicare HMO | Admitting: Sports Medicine

## 2020-11-28 NOTE — Progress Notes (Deleted)
    Aleen Sells D.[facility] [address] [place] [address] Phone: [phone]   Assessment and Plan:     There are no diagnoses linked to this encounter.  ***   Pertinent previous records reviewed include ***   Follow Up: ***     Subjective:    Chief Complaint: Neck, back, shoulder, and hip pain  HPI:   11/28/20 ***  Relevant Historical Information: ***  Additional pertinent review of systems negative.   Current Outpatient Medications:    amitriptyline (ELAVIL) 75 MG tablet, Take 75 mg by mouth at bedtime., Disp: , Rfl:    amLODipine (NORVASC) 10 MG tablet, Take 10 mg by mouth daily., Disp: , Rfl:    atenolol (TENORMIN) 50 MG tablet, Take 100 mg by mouth 2 (two) times daily. , Disp: , Rfl:    cloNIDine (CATAPRES) 0.2 MG tablet, Take 0.2 mg by mouth daily., Disp: , Rfl:    gabapentin (NEURONTIN) 600 MG tablet, Take 2 tablets (1,200 mg total) by mouth 3 (three) times daily. For nerve pain, Disp: 180 tablet, Rfl: 5   Multiple Vitamin (MULTIVITAMIN WITH MINERALS) TABS tablet, Take 1 tablet by mouth daily., Disp: , Rfl:    Objective:     There were no vitals filed for this visit.    There is no height or weight on file to calculate BMI.    Physical Exam:    ***   Electronically signed by:  Aleen Sells D.[facility] 7:34 AM 11/28/20

## 2020-12-12 ENCOUNTER — Ambulatory Visit: Payer: Medicare HMO | Admitting: Physical Medicine and Rehabilitation

## 2021-01-05 ENCOUNTER — Telehealth: Payer: Self-pay | Admitting: *Deleted

## 2021-01-05 NOTE — Telephone Encounter (Signed)
Mr [name] called and he has just returned to this country and he is in a great deal of pain.  The gabapentin is not working and would like to see Dr Berline Chough asap. Please advise if earlier appt available

## 2021-01-06 ENCOUNTER — Encounter: Payer: Self-pay | Admitting: Physical Medicine and Rehabilitation

## 2021-01-06 ENCOUNTER — Encounter: Payer: Medicare HMO | Attending: Physical Medicine and Rehabilitation | Admitting: Physical Medicine and Rehabilitation

## 2021-01-06 ENCOUNTER — Other Ambulatory Visit: Payer: Self-pay

## 2021-01-06 VITALS — BP 123/80 | HR 62 | Temp 99.2°F | Ht 70.0 in | Wt 150.0 lb

## 2021-01-06 DIAGNOSIS — G834 Cauda equina syndrome: Secondary | ICD-10-CM | POA: Diagnosis present

## 2021-01-06 DIAGNOSIS — G894 Chronic pain syndrome: Secondary | ICD-10-CM | POA: Diagnosis present

## 2021-01-06 DIAGNOSIS — Z993 Dependence on wheelchair: Secondary | ICD-10-CM | POA: Insufficient documentation

## 2021-01-06 MED ORDER — GABAPENTIN 300 MG PO CAPS: 1500.0000 mg | ORAL_CAPSULE | Freq: Two times a day (BID) | ORAL | 3 refills | Status: DC

## 2021-01-06 NOTE — Patient Instructions (Signed)
Pt is a 60 yr R handed male with hx of T12/L1 paraplegia- at age 59- 43-  Neurogenic bowel and bladder- no spasticity; colostomy for 3 years- 1991-1994- reversed- likely has adhesions- could also have additional referred pain from severe B/L hip DJD-   And chronic pain due to eating/BMs- Here for f/u on SCI and chronic pain which has gotten worse    We discussed options to get pain under better control- sounds like didn't have a good response to the 600 mg TABS- but had great response to 300 mg capsules- will change BACK to 300 mg capsules and do 1500 mg 2x/day or 3000 mg at once, per pt request- have sent in 90 day supply- and sent in new Rx.   2.  Let me know when going back to [place] By text; or if this isn't working.   3.  F/U already scheduled in January- need to see then.    I spent a total of 21 minutes on pt- 10 minutes on phone with pharmacy in room with pt and discussing options to get pain better controlled.

## 2021-01-06 NOTE — Telephone Encounter (Signed)
Seeing today- ML
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
## 2021-01-09 ENCOUNTER — Encounter: Payer: Medicare HMO | Admitting: Physical Medicine and Rehabilitation
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
## 2021-02-08 ENCOUNTER — Ambulatory Visit: Payer: Medicare HMO | Admitting: Family Medicine

## 2021-02-10 ENCOUNTER — Ambulatory Visit: Payer: Medicare HMO | Admitting: Physical Medicine and Rehabilitation

## 2021-02-21 ENCOUNTER — Other Ambulatory Visit: Payer: Self-pay | Admitting: Physical Medicine and Rehabilitation

## 2021-02-21 MED ORDER — HYDROMORPHONE HCL 4 MG PO TABS: 8.0000 mg | ORAL_TABLET | Freq: Four times a day (QID) | ORAL | 0 refills | Status: DC | PRN

## 2021-03-13 ENCOUNTER — Telehealth: Payer: Self-pay

## 2021-03-13 NOTE — Telephone Encounter (Signed)
PMP REPORT: Filled  Written  ID  Drug  QTY  Days  Prescriber  RX #  Dispenser  Refill  Daily Dose*  Pymt Type  PMP  02/21/2021 02/21/2021 1  Hydromorphone 4 Mg Tablet 76.00 10 Me Lov [id number] Nor ([id number]) 0/0 121.60 MME Medicare

## 2021-03-15 MED ORDER — HYDROMORPHONE HCL 4 MG PO TABS: 8.0000 mg | ORAL_TABLET | Freq: Four times a day (QID) | ORAL | 0 refills | Status: DC | PRN

## 2021-03-15 NOTE — Telephone Encounter (Signed)
Refilled meds- thanks- ML

## 2021-03-31 ENCOUNTER — Other Ambulatory Visit: Payer: Self-pay

## 2021-03-31 ENCOUNTER — Encounter: Payer: Self-pay | Admitting: Physical Medicine and Rehabilitation

## 2021-03-31 ENCOUNTER — Encounter: Payer: Medicare HMO | Attending: Physical Medicine and Rehabilitation | Admitting: Physical Medicine and Rehabilitation

## 2021-03-31 VITALS — BP 104/67 | HR 62 | Ht 70.0 in

## 2021-03-31 DIAGNOSIS — G894 Chronic pain syndrome: Secondary | ICD-10-CM | POA: Insufficient documentation

## 2021-03-31 DIAGNOSIS — G8220 Paraplegia, unspecified: Secondary | ICD-10-CM | POA: Insufficient documentation

## 2021-03-31 DIAGNOSIS — M792 Neuralgia and neuritis, unspecified: Secondary | ICD-10-CM | POA: Insufficient documentation

## 2021-03-31 DIAGNOSIS — Z79899 Other long term (current) drug therapy: Secondary | ICD-10-CM | POA: Insufficient documentation

## 2021-03-31 DIAGNOSIS — Z993 Dependence on wheelchair: Secondary | ICD-10-CM | POA: Diagnosis not present

## 2021-03-31 DIAGNOSIS — Z79891 Long term (current) use of opiate analgesic: Secondary | ICD-10-CM | POA: Diagnosis not present

## 2021-03-31 MED ORDER — OXCARBAZEPINE 300 MG PO TABS: 300.0000 mg | ORAL_TABLET | Freq: Every day | ORAL | 5 refills | Status: DC
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
## 2021-03-31 NOTE — Patient Instructions (Addendum)
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 ?Sugar free maple syrup- is actually Sorbitol- any grocery store- 30cc of it, when you need to poop - if needs to go.  ?Trileptal- ? ?3. Will try and cycle meds every few months to reduce body getting acclimated to meds- which has occurred for pt last 2-3x. Cycle trileptal, lyrica, then gabapentin  ? ?4. Con't Gabapentin 600 mg nightly for now.  ? ?5. Con't Dilaudid as needed for breakthrough pain.  ? ?6. Opiate contract and oral drug screen. Today.  ? ? ?7. Can continue Amitriptyline.  ? ?8.  No soda's and no straws- living vegan! ? ?9. Has enough Dilaudid for 2 more weeks- will wait to refill.  ? ?10. F/U already scheduled. Then see me in 3 months ? ?11. Trileptal/Oxcarbemazepine- 300 mg nightly x 1 week, then 600 mg nightly- for 1 week, then 900 mg nightly- for nerve pain- and check BMP for low sodium levels in 1 month ?

## 2021-04-03 ENCOUNTER — Encounter: Payer: Medicare HMO | Admitting: Physical Medicine and Rehabilitation

## 2021-04-07 LAB — DRUG TOX MONITOR 1 W/CONF, ORAL FLD
Amphetamines: NEGATIVE ng/mL (ref <10)
Barbiturates: NEGATIVE ng/mL (ref <10)
Benzodiazepines: NEGATIVE ng/mL (ref <0.50)
Buprenorphine: NEGATIVE ng/mL (ref <0.10)
Cocaine: NEGATIVE ng/mL (ref <5.0)
Codeine: NEGATIVE ng/mL (ref <2.5)
Dihydrocodeine: NEGATIVE ng/mL (ref <2.5)
Fentanyl: NEGATIVE ng/mL (ref <0.10)
Heroin Metabolite: NEGATIVE ng/mL (ref <1.0)
Hydrocodone: NEGATIVE ng/mL (ref <2.5)
Hydromorphone: 22.4 ng/mL — ABNORMAL HIGH (ref <2.5)
MARIJUANA: NEGATIVE ng/mL (ref <2.5)
MDMA: NEGATIVE ng/mL (ref <10)
Meprobamate: NEGATIVE ng/mL (ref <2.5)
Methadone: NEGATIVE ng/mL (ref <5.0)
Morphine: NEGATIVE ng/mL (ref <2.5)
Nicotine Metabolite: NEGATIVE ng/mL (ref <5.0)
Norhydrocodone: NEGATIVE ng/mL (ref <2.5)
Noroxycodone: NEGATIVE ng/mL (ref <2.5)
Opiates: POSITIVE ng/mL — AB (ref <2.5)
Oxycodone: NEGATIVE ng/mL (ref <2.5)
Oxymorphone: NEGATIVE ng/mL (ref <2.5)
Phencyclidine: NEGATIVE ng/mL (ref <10)
Tapentadol: NEGATIVE ng/mL (ref <5.0)
Tramadol: NEGATIVE ng/mL (ref <5.0)
Zolpidem: NEGATIVE ng/mL (ref <5.0)

## 2021-04-07 LAB — DRUG TOX ALC METAB W/CON, ORAL FLD

## 2021-04-10 ENCOUNTER — Telehealth: Payer: Self-pay | Admitting: *Deleted

## 2021-04-10 ENCOUNTER — Other Ambulatory Visit: Payer: Self-pay | Admitting: Physical Medicine and Rehabilitation

## 2021-04-10 MED ORDER — HYDROMORPHONE HCL 4 MG PO TABS: 8.0000 mg | ORAL_TABLET | Freq: Four times a day (QID) | ORAL | 0 refills | Status: DC | PRN

## 2021-04-10 NOTE — Telephone Encounter (Signed)
Trileptal causes him to have suicidal ideation. So stopped it, appropriately.  ? ? ?Can try Naltrexone- or Keppra- if pt wants me to try. To rotate them-  ?ML ?

## 2021-04-10 NOTE — Telephone Encounter (Signed)
Oral swab drug screen was consistent for prescribed medications. Alcohol assessment was not performed due to insufficient quantity. ?

## 2021-04-18 ENCOUNTER — Telehealth: Payer: Self-pay | Admitting: Physical Medicine and Rehabilitation

## 2021-04-18 ENCOUNTER — Other Ambulatory Visit: Payer: Self-pay | Admitting: Physical Medicine and Rehabilitation

## 2021-04-18 MED ORDER — NALTREXONE HCL 50 MG PO TABS: 25.0000 mg | ORAL_TABLET | Freq: Every day | ORAL | 3 refills | Status: DC

## 2021-04-18 NOTE — Telephone Encounter (Signed)
Pt's pain is getting unbearable again.  ? ?Already on Dilaudid 8 mg TID prn.  ?Failed Trileptal- causes suicidal ideation.  ?Also has tried Gabapentin and Lyrica which worked but stopped working.  ?Will try Naltrexone 25 mg nightly- 1/2 pill nightly-  ?So don't need to compound the drug.  ?Explained to pt that can interact/make dilaudid less effective, but I think this could really be helpful.  ?If doesn't work, will try Keppra.  ?Then Bulbuca/Butrans patch ? ? ?

## 2021-04-20 ENCOUNTER — Encounter (HOSPITAL_COMMUNITY): Payer: Self-pay | Admitting: *Deleted

## 2021-04-20 ENCOUNTER — Emergency Department (HOSPITAL_COMMUNITY)
Admission: EM | Admit: 2021-04-20 | Discharge: 2021-04-20 | Disposition: A | Discharge status: Home / Self Care | Payer: Medicare HMO | Attending: Emergency Medicine | Admitting: Emergency Medicine

## 2021-04-20 ENCOUNTER — Other Ambulatory Visit: Payer: Self-pay

## 2021-04-20 DIAGNOSIS — R110 Nausea: Secondary | ICD-10-CM | POA: Insufficient documentation

## 2021-04-20 DIAGNOSIS — R748 Abnormal levels of other serum enzymes: Secondary | ICD-10-CM | POA: Diagnosis not present

## 2021-04-20 DIAGNOSIS — R197 Diarrhea, unspecified: Secondary | ICD-10-CM | POA: Insufficient documentation

## 2021-04-20 DIAGNOSIS — Z79899 Other long term (current) drug therapy: Secondary | ICD-10-CM | POA: Insufficient documentation

## 2021-04-20 DIAGNOSIS — M791 Myalgia, unspecified site: Secondary | ICD-10-CM | POA: Diagnosis not present

## 2021-04-20 DIAGNOSIS — I10 Essential (primary) hypertension: Secondary | ICD-10-CM | POA: Diagnosis not present

## 2021-04-20 LAB — COMPREHENSIVE METABOLIC PANEL
ALT: 46 U/L — ABNORMAL HIGH (ref 0–44)
AST: 40 U/L (ref 15–41)
Albumin: 4.4 g/dL (ref 3.5–5.0)
Alkaline Phosphatase: 100 U/L (ref 38–126)
Anion gap: 9 (ref 5–15)
BUN: 18 mg/dL (ref 6–20)
CO2: 28 mmol/L (ref 22–32)
Calcium: 9.5 mg/dL (ref 8.9–10.3)
Chloride: 99 mmol/L (ref 98–111)
Creatinine, Ser: 0.54 mg/dL — ABNORMAL LOW (ref 0.61–1.24)
GFR, Estimated: >60 mL/min (ref >60)
Glucose, Bld: 96 mg/dL (ref 70–99)
Potassium: 3.1 mmol/L — ABNORMAL LOW (ref 3.5–5.1)
Sodium: 136 mmol/L (ref 135–145)
Total Bilirubin: 0.9 mg/dL (ref 0.3–1.2)
Total Protein: 8.1 g/dL (ref 6.5–8.1)

## 2021-04-20 LAB — CBC
HCT: 42.4 % (ref 39.0–52.0)
Hemoglobin: 14.4 g/dL (ref 13.0–17.0)
MCH: 30.6 pg (ref 26.0–34.0)
MCHC: 34 g/dL (ref 30.0–36.0)
MCV: 90 fL (ref 80.0–100.0)
Platelets: 259 10*3/uL (ref 150–400)
RBC: 4.71 MIL/uL (ref 4.22–5.81)
RDW: 13 % (ref 11.5–15.5)
WBC: 9.4 10*3/uL (ref 4.0–10.5)
nRBC: 0 % (ref 0.0–0.2)

## 2021-04-20 LAB — LIPASE, BLOOD: Lipase: 261 U/L — ABNORMAL HIGH (ref 11–51)

## 2021-04-20 MED ORDER — SODIUM CHLORIDE 0.9 % IV BOLUS
1000.0000 mL | Freq: Once | INTRAVENOUS | Status: AC
  Administered 2021-04-20: 1000 mL via INTRAVENOUS

## 2021-04-20 MED ORDER — ONDANSETRON 4 MG PO TBDP: 4.0000 mg | ORAL_TABLET | Freq: Three times a day (TID) | ORAL | 0 refills | Status: DC | PRN

## 2021-04-20 MED ORDER — ONDANSETRON HCL 4 MG/2ML IJ SOLN
4.0000 mg | Freq: Once | INTRAMUSCULAR | Status: AC
  Administered 2021-04-20: 4 mg via INTRAVENOUS
  Filled 2021-04-20: qty 2

## 2021-04-20 MED ORDER — LOPERAMIDE HCL 2 MG PO CAPS: 2.0000 mg | ORAL_CAPSULE | Freq: Four times a day (QID) | ORAL | 0 refills | Status: AC | PRN

## 2021-04-20 MED ORDER — LOPERAMIDE HCL 2 MG PO CAPS
2.0000 mg | ORAL_CAPSULE | Freq: Once | ORAL | Status: AC
  Administered 2021-04-20: 2 mg via ORAL
  Filled 2021-04-20: qty 1
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
## 2021-04-20 NOTE — ED Notes (Signed)
Pt arrives and has been incontinent of large amount of liquid brown stool.  Pt changed and legs and buttock and groin area washed and dried, placed in a depend, when pt coughed he was incontinent of a large amout of liquid stool (projectile).  Changed pt and gave warm blankets for comfort.  Pt is having burning pain in his legs (chronic pain) ?

## 2021-04-20 NOTE — Discharge Instructions (Signed)
Your diarrhea is likely related to opiate withdrawal from your low-dose naltrexone.  Your prescriptions to help with the symptoms.  Follow-up with the prescriber about further medications ?

## 2021-04-20 NOTE — ED Notes (Signed)
Pt depend changed, small amount of diarrhea noted ?

## 2021-04-20 NOTE — ED Notes (Signed)
Please note that pt self caths for urine and he reports that he did a self cath pta.  Will collect urine once pt feels he can self cath again.   ?

## 2021-04-20 NOTE — ED Notes (Signed)
Pt is having pain in his legs, burning pain and pt thrashing in bed, has removed gown and declines having it placed back on him.   ?

## 2021-04-20 NOTE — ED Triage Notes (Signed)
Pt from home by [facility].  Pt has a new pain medication Naltrexone and took first dose at 1300 followed by diarrhea.  Pt then began having a burning sensation in his legs as well as abdominal pain.  Pt arrives by ems and has been incontinent of liquid stool (4-5 episodes).  Pt is paraplegic, personal wheelchair was brought ?

## 2021-06-05 ENCOUNTER — Encounter: Payer: Medicare HMO | Attending: Physical Medicine and Rehabilitation | Admitting: Physical Medicine and Rehabilitation

## 2021-06-05 ENCOUNTER — Telehealth: Payer: Self-pay | Admitting: Physical Medicine and Rehabilitation

## 2021-06-05 ENCOUNTER — Encounter: Payer: Self-pay | Admitting: Physical Medicine and Rehabilitation

## 2021-06-05 VITALS — BP 112/61 | HR 58 | Ht 70.0 in | Wt 159.0 lb

## 2021-06-05 DIAGNOSIS — K592 Neurogenic bowel, not elsewhere classified: Secondary | ICD-10-CM | POA: Insufficient documentation

## 2021-06-05 DIAGNOSIS — G834 Cauda equina syndrome: Secondary | ICD-10-CM | POA: Insufficient documentation

## 2021-06-05 DIAGNOSIS — Z993 Dependence on wheelchair: Secondary | ICD-10-CM | POA: Diagnosis present

## 2021-06-05 DIAGNOSIS — R1084 Generalized abdominal pain: Secondary | ICD-10-CM | POA: Insufficient documentation

## 2021-06-05 MED ORDER — GABAPENTIN 300 MG PO CAPS: 1500.0000 mg | ORAL_CAPSULE | Freq: Two times a day (BID) | ORAL | 3 refills | Status: AC

## 2021-06-05 MED ORDER — HYDROMORPHONE HCL 8 MG PO TABS: 8.0000 mg | ORAL_TABLET | Freq: Four times a day (QID) | ORAL | 0 refills | Status: DC | PRN

## 2021-06-05 MED ORDER — LACTULOSE 20 GM/30ML PO SOLN: 20.0000 mL | Freq: Every day | ORAL | 5 refills | Status: AC | PRN

## 2021-06-05 NOTE — Telephone Encounter (Signed)
Pt called- needs refilled- will send gabapentin and Dilaudid- change to 8 mg tab and give 120 tabs.  ?
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
## 2021-06-05 NOTE — Patient Instructions (Signed)
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 ? ?Lactulose- 10 ml at first- if not, enough- increase by 10 ml to a max of 30 ml- 2-3x/week ? ?2. See if this works- but if doesn't, will try Linzess, which will require Prior auth. ? ?3. [facility] is the only place that does DREZ procedure-  ? ? ?4. Asking about abd MRI or camera colonoscopy- did have colostomy originally, so we are both wondering about scar tissue and if that's playing a role.  ? ? ?5. Went over SCI bowel program.  ? A. Do digital stimulation- first-/manual disimpaction first.  ? B/. Then inset suppository- I like Dulcolax vs Glycerin- glycerin is less aggressive.  ? C. Usually takes 20-40 minutes ? D. Do within 1 hour after meal.  ? E gloves and lube ? F- try for minimum of 3-6 weeks to see if works  ? ?6. Get padded seat on Commode- since could spend 20+ minutes on commode- don't want pressure ulcers.  ? ?7. If none of these work, will send to GI- Dr Cathleen Corti.  ? ?8. Try Bowel program and lactulose- if doesn't work, cna try Linzess- if that doesn't work, will send GI referral.  ? ? ?9. F/U- 3 months- double appt.  ? ?

## 2021-06-09 ENCOUNTER — Encounter: Payer: Self-pay | Admitting: *Deleted

## 2021-06-13 NOTE — Telephone Encounter (Signed)
opened in error

## 2021-07-12 ENCOUNTER — Other Ambulatory Visit: Payer: Self-pay | Admitting: Physical Medicine and Rehabilitation

## 2021-07-12 ENCOUNTER — Telehealth: Payer: Self-pay

## 2021-07-12 MED ORDER — HYDROMORPHONE HCL 8 MG PO TABS: 8.0000 mg | ORAL_TABLET | Freq: Four times a day (QID) | ORAL | 0 refills | Status: DC | PRN

## 2021-07-12 NOTE — Telephone Encounter (Signed)
Patient is calling for a refill on Hydromorphone 8 mg. Per PMP, last fill 06/05/21. Can you please fill in Dr. Dahlia Client absence?

## 2021-07-17 ENCOUNTER — Ambulatory Visit: Payer: Medicare HMO | Admitting: Physical Medicine and Rehabilitation

## 2021-08-07 ENCOUNTER — Telehealth: Payer: Self-pay | Admitting: *Deleted

## 2021-08-07 DIAGNOSIS — R1084 Generalized abdominal pain: Secondary | ICD-10-CM

## 2021-08-07 DIAGNOSIS — G8220 Paraplegia, unspecified: Secondary | ICD-10-CM

## 2021-08-07 NOTE — Telephone Encounter (Signed)
Mr Mcparland called and said that Dr Berline Chough was going to give him a referral to a gastroenterologist. He needs that now.

## 2021-08-11 ENCOUNTER — Telehealth: Payer: Self-pay | Admitting: *Deleted

## 2021-08-11 MED ORDER — HYDROMORPHONE HCL 8 MG PO TABS: 8.0000 mg | ORAL_TABLET | Freq: Four times a day (QID) | ORAL | 0 refills | Status: DC | PRN

## 2021-08-11 NOTE — Telephone Encounter (Signed)
Mr Fredell is requesting a refill on his hydromorphone.

## 2021-09-18 ENCOUNTER — Telehealth: Payer: Self-pay

## 2021-09-18 MED ORDER — HYDROMORPHONE HCL 8 MG PO TABS: 8.0000 mg | ORAL_TABLET | Freq: Four times a day (QID) | ORAL | 0 refills | Status: DC | PRN

## 2021-09-18 NOTE — Telephone Encounter (Signed)
Patient requesting refill on hydromorphone to be sent to [facility]

## 2021-09-20 ENCOUNTER — Ambulatory Visit: Payer: Medicare HMO | Admitting: Physical Medicine and Rehabilitation

## 2021-09-22 ENCOUNTER — Encounter: Payer: Medicare HMO | Attending: Physical Medicine and Rehabilitation | Admitting: Physical Medicine and Rehabilitation

## 2021-09-22 ENCOUNTER — Encounter: Payer: Self-pay | Admitting: Physical Medicine and Rehabilitation

## 2021-09-22 VITALS — BP 107/68 | HR 52 | Ht 70.0 in | Wt 155.0 lb

## 2021-09-22 DIAGNOSIS — Z79891 Long term (current) use of opiate analgesic: Secondary | ICD-10-CM | POA: Diagnosis present

## 2021-09-22 DIAGNOSIS — G894 Chronic pain syndrome: Secondary | ICD-10-CM | POA: Diagnosis present

## 2021-09-22 DIAGNOSIS — Z79899 Other long term (current) drug therapy: Secondary | ICD-10-CM | POA: Insufficient documentation

## 2021-09-22 MED ORDER — PREGABALIN 100 MG PO CAPS: 100.0000 mg | ORAL_CAPSULE | Freq: Every day | ORAL | 5 refills | Status: AC

## 2021-09-22 NOTE — Patient Instructions (Signed)
Pt is a 61 yr R handed male with hx of T12/L1 paraplegia- at age 61- 74-  Neurogenic bowel and bladder- no spasticity; colostomy for 3 years- 1991-1994- reversed- likely has adhesions- could also have additional referred pain from severe B/L hip DJD-   And chronic pain due to eating/BMs- Here for f/u on SCI and chronic pain which has gotten worse  If Lactulose doesn't continue to work, then can switch to [employer]- have to take 1 dose before breakfast.    2.. Let's switch back to Lyrica- and hold on Gabapentin- switching back and forth until 1 stops working again.  Lyrica 100 mg nightly x 3 days, then 100 mg 2x/day x 3 days, then 3x/day-   3. Wean Gabapentin- start on day 4 of Lyrica and start weaning gabapentin- goal to be off it in 2 weeks from day 4.  4. Let's reduce Amlodipine to 5 mg (1/2 tab) daily and see feeling and BP doing.  And if that's successful, let PCP know. If not, ask them to titrate BP meds more.     5. Lay on stomach 2x/day- at least 10 minutes 2x/day.    6. SCI support group [id number] 6-7 pm- on last Thursday of the month. At [facility]- conference room- just past gym/small food area room on R    7. F/u q 45months- double appt.
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
## 2021-09-27 ENCOUNTER — Encounter: Payer: Self-pay | Admitting: *Deleted

## 2021-09-27 NOTE — Telephone Encounter (Signed)
Opened in error

## 2021-09-28 LAB — DRUG TOX ALC METAB W/CON, ORAL FLD: Alcohol Metabolite: NEGATIVE ng/mL (ref <25)

## 2021-09-28 LAB — DRUG TOX MONITOR 1 W/CONF, ORAL FLD
Amphetamines: NEGATIVE ng/mL (ref <10)
Barbiturates: NEGATIVE ng/mL (ref <10)
Benzodiazepines: NEGATIVE ng/mL (ref <0.50)
Buprenorphine: NEGATIVE ng/mL (ref <0.10)
Cocaine: NEGATIVE ng/mL (ref <5.0)
Codeine: NEGATIVE ng/mL (ref <2.5)
Dihydrocodeine: NEGATIVE ng/mL (ref <2.5)
Fentanyl: NEGATIVE ng/mL (ref <0.10)
Heroin Metabolite: NEGATIVE ng/mL (ref <1.0)
Hydrocodone: NEGATIVE ng/mL (ref <2.5)
Hydromorphone: 38.4 ng/mL — ABNORMAL HIGH (ref <2.5)
MARIJUANA: NEGATIVE ng/mL (ref <2.5)
MDMA: NEGATIVE ng/mL (ref <10)
Meprobamate: NEGATIVE ng/mL (ref <2.5)
Methadone: NEGATIVE ng/mL (ref <5.0)
Morphine: NEGATIVE ng/mL (ref <2.5)
Nicotine Metabolite: NEGATIVE ng/mL (ref <5.0)
Norhydrocodone: NEGATIVE ng/mL (ref <2.5)
Noroxycodone: NEGATIVE ng/mL (ref <2.5)
Opiates: POSITIVE ng/mL — AB (ref <2.5)
Oxycodone: NEGATIVE ng/mL (ref <2.5)
Oxymorphone: NEGATIVE ng/mL (ref <2.5)
Phencyclidine: NEGATIVE ng/mL (ref <10)
Tapentadol: NEGATIVE ng/mL (ref <5.0)
Tramadol: NEGATIVE ng/mL (ref <5.0)
Zolpidem: NEGATIVE ng/mL (ref <5.0)

## 2021-09-29 ENCOUNTER — Telehealth: Payer: Self-pay | Admitting: *Deleted

## 2021-09-29 NOTE — Telephone Encounter (Signed)
Oral swab drug screen was consistent for prescribed medications.  ?

## 2021-10-03 ENCOUNTER — Telehealth: Payer: Self-pay | Admitting: Physical Medicine and Rehabilitation

## 2021-10-03 NOTE — Telephone Encounter (Signed)
Patient came to office asking for referal to go to Physical Therapy (he wants to go to the one he met at your support group).

## 2021-10-05 ENCOUNTER — Encounter: Payer: Self-pay | Admitting: Physical Medicine and Rehabilitation

## 2021-10-05 ENCOUNTER — Other Ambulatory Visit: Payer: Self-pay | Admitting: Physical Medicine and Rehabilitation

## 2021-10-10 ENCOUNTER — Telehealth: Payer: Self-pay | Admitting: *Deleted

## 2021-10-10 MED ORDER — HYDROMORPHONE HCL 8 MG PO TABS: 8.0000 mg | ORAL_TABLET | Freq: Four times a day (QID) | ORAL | 0 refills | Status: DC | PRN

## 2021-10-10 NOTE — Telephone Encounter (Signed)
I spoke with [name] about increasing his medication on his own without the permission of the MD. He says it is not working (even the 6/day) so what is he supposed to do. He would like you to call him [phone]

## 2021-10-10 NOTE — Telephone Encounter (Signed)
Albert [name] called and is saying he sent a message to Dr [name] on the 7th and hasnt heard back from her. He is out of dilaudid today.  I see that a refill was sent in today, but he reports he is taking 6 tablets per day and not 4 ( 3 tablets bid). This is not how it is ordered.  Looking back through messages it looks like you responded to Albert [name] message but it was replied to [facility] CMA and not directly to [name]. He never received the reply. [name] is out of the office. Please make sure you are sending the reply to the patient and not the clinical staff that forwarded the message.

## 2021-11-24 ENCOUNTER — Encounter: Payer: Self-pay | Admitting: Physical Medicine and Rehabilitation

## 2021-11-24 ENCOUNTER — Encounter: Payer: Medicare HMO | Attending: Physical Medicine and Rehabilitation | Admitting: Physical Medicine and Rehabilitation

## 2021-11-24 VITALS — BP 111/71 | HR 83 | Ht 70.0 in | Wt 171.0 lb

## 2021-11-24 DIAGNOSIS — R1084 Generalized abdominal pain: Secondary | ICD-10-CM | POA: Diagnosis not present

## 2021-11-24 DIAGNOSIS — Z993 Dependence on wheelchair: Secondary | ICD-10-CM | POA: Insufficient documentation

## 2021-11-24 DIAGNOSIS — G8221 Paraplegia, complete: Secondary | ICD-10-CM | POA: Diagnosis not present

## 2021-11-24 MED ORDER — HYDROMORPHONE HCL 8 MG PO TABS: 8.0000 mg | ORAL_TABLET | Freq: Four times a day (QID) | ORAL | 0 refills | Status: DC | PRN

## 2021-11-24 NOTE — Patient Instructions (Signed)
Pt is a 61 yr R handed male with hx of T12/L1 paraplegia- at age 60- 71-  Neurogenic bowel and bladder- no spasticity; colostomy for 3 years- 1991-1994- reversed- likely has adhesions- could also have additional referred pain from severe B/L hip DJD-   And chronic pain due to eating/BMs- Here for f/u on SCI and chronic pain which has gotten worse   Pain regimen is working for him right now, so doesn't want to change up for long-acting medication.   2.  If has dizziness, next AM, cut Amlodipine in half!!! And take 5 mg in stead of 10 mg until dizziness is better.   3. Wrote letter while in room with pt stating his pai medicines are appropriate and his catheters are necessary and should not be charged to travel with them.    4. Refill Dilaudid 8 mg q6 hour sprn #120- con't regimen   5. Referral to PT- with Lovena Le- at [facility]- for endurance, stretching and strengthening- hips SO tight.    6. F/U in 3 months- double appt- SCI
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
## 2021-12-01 ENCOUNTER — Encounter: Payer: Self-pay | Admitting: Physical Therapy

## 2021-12-01 ENCOUNTER — Other Ambulatory Visit: Payer: Self-pay

## 2021-12-01 ENCOUNTER — Ambulatory Visit: Payer: Medicare HMO | Attending: Family Medicine | Admitting: Physical Therapy

## 2021-12-01 VITALS — BP 145/86 | HR 63

## 2021-12-01 DIAGNOSIS — G8221 Paraplegia, complete: Secondary | ICD-10-CM | POA: Diagnosis not present

## 2021-12-01 DIAGNOSIS — M25552 Pain in left hip: Secondary | ICD-10-CM | POA: Insufficient documentation

## 2021-12-01 DIAGNOSIS — M25551 Pain in right hip: Secondary | ICD-10-CM | POA: Insufficient documentation
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
## 2021-12-04 ENCOUNTER — Ambulatory Visit: Payer: Medicare HMO | Admitting: Physical Therapy

## 2021-12-05 ENCOUNTER — Ambulatory Visit: Payer: Medicare HMO | Admitting: Physical Therapy

## 2021-12-06 ENCOUNTER — Ambulatory Visit: Payer: Medicare HMO | Admitting: Physical Therapy

## 2021-12-12 ENCOUNTER — Ambulatory Visit: Payer: Medicare HMO | Admitting: Physical Therapy

## 2021-12-13 ENCOUNTER — Ambulatory Visit: Payer: Medicare HMO | Admitting: Physical Therapy

## 2021-12-14 ENCOUNTER — Ambulatory Visit: Payer: Medicare HMO | Admitting: Physical Therapy

## 2021-12-18 ENCOUNTER — Ambulatory Visit: Payer: Medicare HMO | Admitting: Physical Therapy

## 2021-12-19 ENCOUNTER — Ambulatory Visit: Payer: Medicare HMO | Admitting: Physical Therapy

## 2021-12-27 ENCOUNTER — Ambulatory Visit: Payer: Medicare HMO | Admitting: Physical Therapy

## 2021-12-28 ENCOUNTER — Ambulatory Visit: Payer: Medicare HMO | Admitting: Physical Therapy

## 2021-12-29 ENCOUNTER — Ambulatory Visit: Payer: Medicare HMO | Attending: Family Medicine | Admitting: Physical Therapy

## 2022-01-01 ENCOUNTER — Ambulatory Visit: Payer: Medicare HMO | Admitting: Physical Therapy

## 2022-01-08 ENCOUNTER — Ambulatory Visit: Payer: Medicare HMO | Admitting: Physical Medicine and Rehabilitation

## 2022-01-30 ENCOUNTER — Telehealth: Payer: Self-pay | Admitting: Physical Medicine and Rehabilitation

## 2022-01-30 NOTE — Telephone Encounter (Signed)
Requesting refill for hydromorphone please send to [facility] on [address]

## 2022-01-31 MED ORDER — HYDROMORPHONE HCL 8 MG PO TABS: 8.0000 mg | ORAL_TABLET | Freq: Four times a day (QID) | ORAL | 0 refills | Status: AC | PRN

## 2022-02-09 ENCOUNTER — Telehealth: Payer: Self-pay | Admitting: Physical Medicine and Rehabilitation

## 2022-02-09 NOTE — Telephone Encounter (Signed)
Patient needs a refill on Hydromorphone sent to [facility] @ [address].

## 2022-02-26 ENCOUNTER — Encounter: Payer: Medicare HMO | Attending: Physical Medicine and Rehabilitation | Admitting: Physical Medicine and Rehabilitation

## 2023-04-18 ENCOUNTER — Telehealth: Payer: Self-pay | Admitting: Physical Medicine and Rehabilitation

## 2023-04-18 NOTE — Telephone Encounter (Signed)
 Patient is asking for refill on prescriptions. He is currently in [place]. He said the prescriptions are delivered to his house. You have not seen him since 11/24/2021. He also stated he could do a MyChart visit. Please advise.

## 2023-04-19 NOTE — Telephone Encounter (Signed)
 Correction** He can do telephone visit not MyChart visit.

## 2023-04-19 NOTE — Telephone Encounter (Signed)
 Message Received: Today Lovorn, Megan, MD sent to [facility] Caller: Unspecified (Yesterday,  3:57 PM) Unfortunately, I checked with Dr Wynn Banker- I need to do a drug screen on a patient who hasn't been seen in 6+ months and it's been 18 months- I am not allowed to refill his Dilaudid for him- I would be able to refill his nerve pain meds with a telephone visit, but I don't know when to work that in- can you guys look?  Attempt to call patient back twice. According to his voice mail [place]. I was not able to leave a message.
